# Patient Record
Sex: Male | Born: 1970
Health system: Southern US, Community
[De-identification: ages and names within clinical notes are randomized; demographics above are authoritative.]

## PROBLEM LIST (undated history)

## (undated) DIAGNOSIS — I10 Essential (primary) hypertension: Secondary | ICD-10-CM

## (undated) DIAGNOSIS — R011 Cardiac murmur, unspecified: Secondary | ICD-10-CM

## (undated) DIAGNOSIS — F1020 Alcohol dependence, uncomplicated: Secondary | ICD-10-CM

## (undated) DIAGNOSIS — F329 Major depressive disorder, single episode, unspecified: Secondary | ICD-10-CM

## (undated) DIAGNOSIS — F4310 Post-traumatic stress disorder, unspecified: Secondary | ICD-10-CM

---

## 2008-02-12 ENCOUNTER — Emergency Department (HOSPITAL_COMMUNITY): Admission: EM | Admit: 2008-02-12 | Discharge: 2008-02-13 | Payer: Self-pay | Admitting: Emergency Medicine

## 2008-02-14 ENCOUNTER — Inpatient Hospital Stay (HOSPITAL_COMMUNITY): Admission: EM | Admit: 2008-02-14 | Discharge: 2008-02-19 | Payer: Self-pay | Admitting: Emergency Medicine

## 2008-02-19 ENCOUNTER — Inpatient Hospital Stay (HOSPITAL_COMMUNITY): Admission: EM | Admit: 2008-02-19 | Discharge: 2008-02-22 | Payer: Self-pay | Admitting: Emergency Medicine

## 2008-02-24 ENCOUNTER — Inpatient Hospital Stay (HOSPITAL_COMMUNITY): Admission: EM | Admit: 2008-02-24 | Discharge: 2008-02-27 | Payer: Self-pay | Admitting: Emergency Medicine

## 2008-02-28 ENCOUNTER — Emergency Department (HOSPITAL_COMMUNITY): Admission: EM | Admit: 2008-02-28 | Discharge: 2008-02-28 | Payer: Self-pay | Admitting: Emergency Medicine

## 2008-03-04 ENCOUNTER — Emergency Department (HOSPITAL_COMMUNITY): Admission: EM | Admit: 2008-03-04 | Discharge: 2008-03-04 | Payer: Self-pay | Admitting: Emergency Medicine

## 2008-03-05 ENCOUNTER — Emergency Department (HOSPITAL_COMMUNITY): Admission: EM | Admit: 2008-03-05 | Discharge: 2008-03-05 | Payer: Self-pay | Admitting: Emergency Medicine

## 2008-03-05 ENCOUNTER — Emergency Department (HOSPITAL_BASED_OUTPATIENT_CLINIC_OR_DEPARTMENT_OTHER): Admission: EM | Admit: 2008-03-05 | Discharge: 2008-03-05 | Payer: Self-pay | Admitting: Emergency Medicine

## 2009-09-05 ENCOUNTER — Emergency Department (HOSPITAL_BASED_OUTPATIENT_CLINIC_OR_DEPARTMENT_OTHER): Admission: EM | Admit: 2009-09-05 | Discharge: 2009-09-06 | Payer: Self-pay | Admitting: Emergency Medicine

## 2009-10-31 ENCOUNTER — Emergency Department (HOSPITAL_BASED_OUTPATIENT_CLINIC_OR_DEPARTMENT_OTHER): Admission: EM | Admit: 2009-10-31 | Discharge: 2009-10-31 | Payer: Self-pay | Admitting: Emergency Medicine

## 2009-11-13 ENCOUNTER — Ambulatory Visit: Payer: Self-pay | Admitting: Diagnostic Radiology

## 2009-11-13 ENCOUNTER — Emergency Department (HOSPITAL_BASED_OUTPATIENT_CLINIC_OR_DEPARTMENT_OTHER): Admission: EM | Admit: 2009-11-13 | Discharge: 2009-11-14 | Payer: Self-pay | Admitting: Emergency Medicine

## 2010-05-17 ENCOUNTER — Emergency Department (HOSPITAL_BASED_OUTPATIENT_CLINIC_OR_DEPARTMENT_OTHER)
Admission: EM | Admit: 2010-05-17 | Discharge: 2010-05-17 | Payer: Self-pay | Source: Home / Self Care | Admitting: Emergency Medicine

## 2010-05-17 LAB — URINALYSIS, ROUTINE W REFLEX MICROSCOPIC
Bilirubin Urine: NEGATIVE
Hemoglobin, Urine: NEGATIVE
Ketones, ur: NEGATIVE mg/dL
Nitrite: NEGATIVE
Protein, ur: NEGATIVE mg/dL
Specific Gravity, Urine: 1.021 (ref 1.005–1.030)
Urine Glucose, Fasting: NEGATIVE mg/dL
Urobilinogen, UA: 0.2 mg/dL (ref 0.0–1.0)
pH: 6.5 (ref 5.0–8.0)

## 2010-06-07 ENCOUNTER — Emergency Department (HOSPITAL_BASED_OUTPATIENT_CLINIC_OR_DEPARTMENT_OTHER)
Admission: EM | Admit: 2010-06-07 | Discharge: 2010-06-07 | Payer: Self-pay | Source: Home / Self Care | Admitting: Emergency Medicine

## 2010-06-23 ENCOUNTER — Emergency Department (HOSPITAL_BASED_OUTPATIENT_CLINIC_OR_DEPARTMENT_OTHER)
Admission: EM | Admit: 2010-06-23 | Discharge: 2010-06-23 | Discharge status: Against Medical Advice | Payer: Self-pay | Attending: Emergency Medicine | Admitting: Emergency Medicine

## 2010-06-23 ENCOUNTER — Emergency Department: Payer: Self-pay | Admitting: Unknown Physician Specialty

## 2010-06-27 ENCOUNTER — Inpatient Hospital Stay (INDEPENDENT_AMBULATORY_CARE_PROVIDER_SITE_OTHER)
Admission: RE | Admit: 2010-06-27 | Discharge: 2010-06-27 | Disposition: A | Discharge status: Home / Self Care | Payer: Self-pay | Source: Ambulatory Visit

## 2010-06-27 DIAGNOSIS — IMO0002 Reserved for concepts with insufficient information to code with codable children: Secondary | ICD-10-CM

## 2010-07-29 LAB — URINALYSIS, ROUTINE W REFLEX MICROSCOPIC
Bilirubin Urine: NEGATIVE
Bilirubin Urine: NEGATIVE
Glucose, UA: NEGATIVE mg/dL
Glucose, UA: NEGATIVE mg/dL
Hgb urine dipstick: NEGATIVE
Hgb urine dipstick: NEGATIVE
Ketones, ur: NEGATIVE mg/dL
Ketones, ur: NEGATIVE mg/dL
Nitrite: NEGATIVE
Nitrite: NEGATIVE
Protein, ur: NEGATIVE mg/dL
Protein, ur: NEGATIVE mg/dL
Specific Gravity, Urine: 1.021 (ref 1.005–1.030)
Specific Gravity, Urine: 1.024 (ref 1.005–1.030)
Urobilinogen, UA: 0.2 mg/dL (ref 0.0–1.0)
Urobilinogen, UA: 0.2 mg/dL (ref 0.0–1.0)
pH: 7 (ref 5.0–8.0)
pH: 6.5 (ref 5.0–8.0)

## 2010-08-09 ENCOUNTER — Emergency Department (HOSPITAL_COMMUNITY): Payer: Self-pay

## 2010-08-09 ENCOUNTER — Emergency Department (HOSPITAL_COMMUNITY)
Admission: EM | Admit: 2010-08-09 | Discharge: 2010-08-09 | Disposition: A | Discharge status: Home / Self Care | Payer: Self-pay | Attending: Emergency Medicine | Admitting: Emergency Medicine

## 2010-08-09 DIAGNOSIS — R4182 Altered mental status, unspecified: Secondary | ICD-10-CM | POA: Insufficient documentation

## 2010-08-09 DIAGNOSIS — F29 Unspecified psychosis not due to a substance or known physiological condition: Secondary | ICD-10-CM | POA: Insufficient documentation

## 2010-08-09 DIAGNOSIS — F1010 Alcohol abuse, uncomplicated: Secondary | ICD-10-CM | POA: Insufficient documentation

## 2010-08-09 LAB — BLOOD GAS, ARTERIAL
Acid-base deficit: 6.2 mmol/L — ABNORMAL HIGH (ref 0.0–2.0)
Bicarbonate: 19.8 mEq/L — ABNORMAL LOW (ref 20.0–24.0)
Drawn by: 309961
O2 Content: 2 L/min
O2 Saturation: 93.9 %
Patient temperature: 98.6
TCO2: 17.6 mmol/L (ref 0–100)
pCO2 arterial: 42.8 mmHg (ref 35.0–45.0)
pH, Arterial: 7.288 — ABNORMAL LOW (ref 7.350–7.450)
pO2, Arterial: 78 mmHg — ABNORMAL LOW (ref 80.0–100.0)

## 2010-08-09 LAB — URINALYSIS, ROUTINE W REFLEX MICROSCOPIC
Bilirubin Urine: NEGATIVE
Glucose, UA: NEGATIVE mg/dL
Hgb urine dipstick: NEGATIVE
Ketones, ur: NEGATIVE mg/dL
Nitrite: NEGATIVE
Protein, ur: NEGATIVE mg/dL
Specific Gravity, Urine: 1.014 (ref 1.005–1.030)
Urobilinogen, UA: 0.2 mg/dL (ref 0.0–1.0)
pH: 5.5 (ref 5.0–8.0)

## 2010-08-09 LAB — DIFFERENTIAL
Basophils Absolute: 0.1 10*3/uL (ref 0.0–0.1)
Basophils Relative: 1 % (ref 0–1)
Eosinophils Absolute: 0 10*3/uL (ref 0.0–0.7)
Eosinophils Relative: 0 % (ref 0–5)
Lymphocytes Relative: 18 % (ref 12–46)
Lymphs Abs: 1.7 10*3/uL (ref 0.7–4.0)
Monocytes Absolute: 0.5 10*3/uL (ref 0.1–1.0)
Monocytes Relative: 6 % (ref 3–12)
Neutro Abs: 6.9 10*3/uL (ref 1.7–7.7)
Neutrophils Relative %: 75 % (ref 43–77)

## 2010-08-09 LAB — AMMONIA: Ammonia: 66 umol/L — ABNORMAL HIGH (ref 11–35)

## 2010-08-09 LAB — COMPREHENSIVE METABOLIC PANEL
ALT: 37 U/L (ref 0–53)
AST: 30 U/L (ref 0–37)
Albumin: 3.8 g/dL (ref 3.5–5.2)
Alkaline Phosphatase: 57 U/L (ref 39–117)
BUN: 10 mg/dL (ref 6–23)
CO2: 21 mEq/L (ref 19–32)
Calcium: 8.5 mg/dL (ref 8.4–10.5)
Chloride: 118 mEq/L — ABNORMAL HIGH (ref 96–112)
Creatinine, Ser: 1.25 mg/dL (ref 0.4–1.5)
GFR calc Af Amer: >60 mL/min (ref >60)
GFR calc non Af Amer: >60 mL/min (ref >60)
Glucose, Bld: 104 mg/dL — ABNORMAL HIGH (ref 70–99)
Potassium: 4.4 mEq/L (ref 3.5–5.1)
Sodium: 150 mEq/L — ABNORMAL HIGH (ref 135–145)
Total Bilirubin: 0.2 mg/dL — ABNORMAL LOW (ref 0.3–1.2)
Total Protein: 7.1 g/dL (ref 6.0–8.3)

## 2010-08-09 LAB — CBC
HCT: 45.3 % (ref 39.0–52.0)
Hemoglobin: 16 g/dL (ref 13.0–17.0)
MCH: 31.6 pg (ref 26.0–34.0)
MCHC: 35.3 g/dL (ref 30.0–36.0)
MCV: 89.3 fL (ref 78.0–100.0)
Platelets: 316 10*3/uL (ref 150–400)
RBC: 5.07 MIL/uL (ref 4.22–5.81)
RDW: 13.3 % (ref 11.5–15.5)
WBC: 9.2 10*3/uL (ref 4.0–10.5)

## 2010-08-09 LAB — ETHANOL: Alcohol, Ethyl (B): 275 mg/dL — ABNORMAL HIGH (ref 0–10)

## 2010-08-09 LAB — RAPID URINE DRUG SCREEN, HOSP PERFORMED
Amphetamines: NOT DETECTED
Barbiturates: POSITIVE — AB
Benzodiazepines: NOT DETECTED
Cocaine: NOT DETECTED
Opiates: NOT DETECTED
Tetrahydrocannabinol: NOT DETECTED

## 2010-08-09 LAB — GLUCOSE, CAPILLARY: Glucose-Capillary: 117 mg/dL — ABNORMAL HIGH (ref 70–99)

## 2010-08-10 LAB — URINE CULTURE
Colony Count: NO GROWTH
Culture  Setup Time: 201203291514
Culture: NO GROWTH

## 2010-09-25 NOTE — Consult Note (Signed)
Russell Nichols, Russell Nichols             ACCOUNT NO.:  [id number]   MEDICAL RECORD NO.:  [id number]          PATIENT TYPE:  INP   LOCATION:  0106                         FACILITY:  [facility]   PHYSICIAN:  Antonietta Breach, M.D.  DATE OF BIRTH:  [birth year]   DATE OF CONSULTATION:  DATE OF DISCHARGE:                                 CONSULTATION   Please see the undersigned's consult of earlier this month.   Mr. Russell Nichols after discharge began a severe alcohol binge involving  multiple forms of ethanol including Listerine.  He presented to the  emergency room with severe abdominal pain, tremors, nausea, retching.  He is not having any suicidal thoughts now.  He was having some  increased shame and suicidal thoughts earlier but these have recovered  since he has received ego support and care in the ER as well as  discussion of the 12-step principles.  He is not having any thoughts of  harming others.  He has no hallucinations or delusions.  His orientation  and memory function are intact.  He is motivated for help.   MENTAL STATUS EXAM:  Please see the above.  Mr. Russell Nichols does have gross  tremors.  He is having intermittent retching.  His insight and judgment  are intact.  He is oriented to all spheres.  Thought process is within  normal limits.  Again, see the above.   ASSESSMENT:  Alcohol dependence with need for current detox.  Mr.  Russell Nichols is not at risk to harm himself or others.   RECOMMENDATIONS:  1. General medical admission for detox and general medical support of      any co-morbidity.  2. Once he is discharged the recommendation is inpatient chemical      dependency rehabilitation.  3. Mr. Russell Nichols is motivated to see a chaplain for additional spiritual      counseling.  4. Please have the case manager social worker put some 12-step      materials in the room for the patient.  5. Mr. Russell Nichols agrees to call a nursing station or if outside the      hospital 9-1-1 for any thoughts of  harming himself, thoughts of      harming others or distress.      Antonietta Breach, M.D.  Electronically Signed     JW/MEDQ  D:  02/24/2008  T:  02/24/2008  Job:  [id number]
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
## 2010-09-25 NOTE — Consult Note (Signed)
NAMEZACHAREY, Russell Nichols             ACCOUNT NO.:  [id number]   MEDICAL RECORD NO.:  [id number]          PATIENT TYPE:  INP   LOCATION:  1516                         FACILITY:  [facility]   PHYSICIAN:  Russell Nichols, M.D.  DATE OF BIRTH:  1970-10-02   DATE OF CONSULTATION:  02/16/2008  DATE OF DISCHARGE:                                 CONSULTATION   REFERRING PHYSICIAN:  [facility].   REASON FOR CONSULTATION:  Alcohol dependence.   HISTORY OF PRESENT ILLNESS:  Mr. Russell Nichols is a 40 year old male admitted  to the [facility] on the 4th of October 2009 due to alcohol  intoxication.   Mr. Russell Nichols has been drinking a pint of liquor daily along with  Listerine and other sources of ethanol that he can get a hold of.  His  mood and interests are normal.  He does not have any thoughts of harming  himself or others.  His orientation and memory function are intact.   PAST PSYCHIATRIC HISTORY:  No history of mania.  Mr. Russell Nichols did spend a  recent inpatient rehabilitation program at [facility].   FAMILY PSYCHIATRIC HISTORY:  None known.   SOCIAL HISTORY:  Mr. Russell Nichols is single.  He does not have any children.  He used to work in [employer].  He has been drinking 1 pint  of liquor per day regularly.   PAST MEDICAL HISTORY:  Abdominal pain.   Alcohol level was 376 in the emergency room.  SGOT 29, SGPT 24.  Urine  drug screen:  Benzodiazepines.   ALLERGIES:  PENICILLIN.   MEDICATIONS:  No psychotropics other than the Ativan detox protocol.  He  is on thiamine 100 mg daily.   REVIEW OF SYSTEMS:  Noncontributory.   Temperature 98.4, pulse 74, respiratory rate 16, blood pressure 127/87,  O2 saturation on room air 95%.   MENTAL STATUS EXAM:  Mr. Russell Nichols is alert.  He is oriented to all  spheres.  Thought process logical, coherent, goal-directed.  No  looseness of associations.  Affect broad and appropriate.  Thought  content:  No thoughts of harming himself.  No  thoughts of harming  others, no delusions, no hallucinations.  Speech within normal limits.  Affect broad and appropriate.  Memory intact, orientation intact.  Insight and judgment intact.   ASSESSMENT:  Axis I:  Alcohol dependence.  Axis II:  Deferred.  Axis III:  See past medical history.  Axis IV:  Primary support group.  Axis V:  55.   Mr. Russell Nichols agrees to not drive if drowsy.  He agrees to call emergency  services if he develops thoughts of harming himself or others.   Mr. Russell Nichols is not at risk to harm himself.   The undersigned provided ego supportive psychotherapy and education  including a reinforcement of 12-step group attendance.   RECOMMENDATIONS:  Would proceed with the Ativan detox protocol.  Would  continue thiamine 100 mg daily indefinitely.   Mr. Russell Nichols declines any inpatient chemical dependency rehabilitation at  this time.  He wants to attend an outpatient program.  Would ask the  case manager  or social worker to look into outpatient [facility] and 12-  step group information for the patient.      Russell Nichols, M.D.  Electronically Signed     JW/MEDQ  D:  02/17/2008  T:  02/17/2008  Job:  [id number]   cc:   [facility]

## 2010-09-25 NOTE — H&P (Signed)
NAMEMCLANE, ARORA             ACCOUNT NO.:  [id number]   MEDICAL RECORD NO.:  [id number]          PATIENT TYPE:  INP   LOCATION:  0106                         FACILITY:  [facility]   PHYSICIAN:  Lonia Blood, M.D.       DATE OF BIRTH:  1970-10-25   DATE OF ADMISSION:  02/23/2008  DATE OF DISCHARGE:                              HISTORY & PHYSICAL   PRIMARY CARE PHYSICIAN:  This patient does not have a primary care  physician.   CHIEF COMPLAINT:  Abdominal pain and suicidal ideation.   HISTORY OF PRESENT ILLNESS:  Mr. Rosengren is a 40 year old gentleman who  presented to the emergency room after he called EMS and said  he was  trying to commit suicide by drinking a bottle of mouthwash.  He had  severe abdominal pain and was transported to the emergency room where he  was observed and later cleared to go to a psychiatric facility.  While  sitting in the emergency room, the patient tried to elope and he was  brought back and committed via the magistrate.  While the patient was in  the emergency room waiting for psychiatric admission he drank some hand  sanitizer.  The patient is complaining of severe abdominal pain and  nausea.  On February 14, 2008 the patient was admitted for abdominal pain,  nausea and vomiting and was seen by the psychiatrist and cleared to go  to outpatient alcohol detoxification.  The patient never pursued alcohol  cessation and in fact he continued to drink alcohol heavily.  There are  no reports of vomiting blood or coffee-ground emesis in the emergency  room.  Currently the patient is sedated and barely able to arouse,  saying that his stomach is not in so much pain, but overall he cannot  stay awake for more than 5 seconds.   PAST MEDICAL HISTORY:  Substance abuse and maybe pancreatitis.   MEDICATIONS:  None.   ALLERGIES:  PENICILLIN.   SOCIAL HISTORY:  The patient lives with his fiancee, he is unemployed.  He used to work in [employer].  He  drinks liquor heavily  every day, smokes a half pack of cigarettes a day.   FAMILY HISTORY:  Positive for substance abuse.  Negative for depression.   PHYSICAL EXAMINATION:  VITAL SIGNS:  Temperature 98.3, blood pressure  142/95, heart rate 105, respiratory rate 20, saturation 95% on room-ir.  GENERAL APPEARANCE:  A lethargic gentleman, arousable, in some in no  acute distress.  He was oriented to place.  HEENT:  Eyes:  Pupils equal, round, reactive to light and accommodation.  Extraocular movements intact.  Throat clear.  NECK:  Supple.  No JVD.  CHEST:  Clear without wheeze, rhonchi or crackles.  HEART:  Regular rate and rhythm without murmurs, rubs or gallops.  ABDOMEN:  Soft.  There is significant tenderness in the epigastric area.  No rebound no guarding.  Bowel sounds are decreased but present.  LOWER EXTREMITIES:  Without edema.  SKIN:  The skin is covered with tattoos and the excoriations.  NEUROLOGIC EXAM:  Cranial nerves II-XII are  intact.  Cannot ambulate the  patient since he is too unsteady from his sedation.  He seems to have  intact strength as he is able to pull himself up from the stretcher.  Deep tendon reflexes are +2 bilaterally symmetric.   LABORATORY VALUES ON ADMISSION:  White blood cell count is 8.3,  hemoglobin 16.5, platelet count is 333.  Urine drug screen positive for  opiates and benzodiazepines.  Alcohol level is 178. Lipase, amylase  within normal limits.  Complete metabolic profile within normal limits   ASSESSMENT AND PLAN:  1. Abdominal pain, nausea, vomiting, suspect due to ingestion of      alcohol mouthwash.  My plan is to closely observe the patient.  I      will place the patient on a proton pump inhibitor and symptomatic      treatment.  Will obtain a stat serum osmolality level to rule out      ingestion of isopropyl alcohol for the time being.  I will keep the      patient on telemetry.  As to prevent worsening delirium and      agitation  from alcohol withdrawal the patient will be started on an      Ativan protocol.  Emergent psychiatric evaluation has been      requested.  DVT prophylaxis will be done using heparin.      Lonia Blood, M.D.  Electronically Signed     SL/MEDQ  D:  02/24/2008  T:  02/24/2008  Job:  [id number]

## 2010-09-28 NOTE — Discharge Summary (Signed)
[name], Russell Nichols             ACCOUNT NO.:  [id number]   MEDICAL RECORD NO.:  [id number]          PATIENT TYPE:  INP   LOCATION:  1502                         FACILITY:  [facility]   PHYSICIAN:  Lonia Blood, M.D.       DATE OF BIRTH:  24-Dec-1970   DATE OF ADMISSION:  02/23/2008  DATE OF DISCHARGE:  02/27/2008                               DISCHARGE SUMMARY   PRIMARY CARE PHYSICIAN:  [facility].   DISCHARGE DIAGNOSES:  1. Nausea, vomiting, abdominal pain most likely due to gastritis and      esophagitis.  2. Alcohol withdrawal.  3. Alcoholism.  4. Acute alcoholic intoxication, resolved.  5. Suicidal ideations.  6. Depression.  7. Tobacco use   DISCHARGE MEDICATIONS:  1. Folic acid 1 mg daily.  2. Vitamin B1 100 mg daily.  3. Omeprazole 20 mg twice a day.  4. Ativan 1 mg to take three a day then two a day and then one a day      and eventually to stop.   HOSPITAL PROCEDURES:  No procedures done.   HOSPITAL CONSULTATIONS:  The patient was seen in consultation by Dr.  Antonietta Breach from psychiatry.   HISTORY AND PHYSICAL:  Refer to dictated H and P done by Dr. Lonia Blood  on February 23, 2008.   HOSPITAL COURSE:  [name] is a 40 year old gentleman with  significant alcohol abuse who presented to the emergency room with  complaints of severe abdominal pain, nausea, vomiting. While he was  awaiting to be evaluated, he jumped out of bed and drank some of the  disinfectant gel that was on the wall.  The patient was admitted for  further workup and observation. During his brief hospital stay  the  patient required intensive treatment for his nausea and vomiting with  intravenous fluids, antiemetics and analgesics.  The patient was seen by  Dr. Antonietta Breach from psychiatry who felt the patient was safe to discharge  with strong encouragments to pursue outpatient alcohol detoxification.  [name] was discharged home on February 27, 2008, with  instructions  to follow up with [facility] outpatient programm and with [facility] for his medical needs.      Lonia Blood, M.D.  Electronically Signed     SL/MEDQ  D:  03/24/2008  T:  03/25/2008  Job:  [id number]   cc:   Melvern Banker  Fax: [phone]

## 2010-09-28 NOTE — Discharge Summary (Signed)
NAMEBRAISON, Russell Nichols             ACCOUNT NO.:  [id number]   MEDICAL RECORD NO.:  [id number]          PATIENT TYPE:  INP   LOCATION:  1516                         FACILITY:  [facility]   PHYSICIAN:  Lonia Blood, M.D.      DATE OF BIRTH:  1971-05-10   DATE OF ADMISSION:  02/19/2008  DATE OF DISCHARGE:  02/22/2008                               DISCHARGE SUMMARY   PRIMARY CARE PHYSICIAN:  The patient was unassigned to [place].   DISCHARGE DIAGNOSES:  1. Alcohol intoxication and withdrawal.  2. Chronic alcoholism.  The patient drinking hand sanitizer in the      hospital.  3. Gastritis with abdominal pain.   DISCHARGE MEDICATIONS:  1. Ativan 1 mg p.o. every 8 hours p.r.n.  2. Lortab 5/500 one tablet every 6 hours p.r.n.  3. Thiamine 100 mg daily.  4. Folic acid 1 mg daily.   DISPOSITION:  The patient seemed to be sober at this point.  He has  received counseling on alcoholism and also resources given to the  patient to follow up with outpatient substance abuse program.   PROCEDURES PERFORMED:  None.   CONSULTATION:  Mainly psychiatric.   BRIEF HISTORY AND PHYSICAL:  Please refer to dictated history and  physical earlier and that also by Dr. Lodema Hong.  In short, however, the  patient was re-admitted on October 9 after leaving the hospital that  morning against medical advice.  The patient was undergoing  detoxification for his alcoholism when he suddenly decided to leave AMA  to be with his girlfriend who was also found to have alcohol  intoxication and subsequently admitted.  While his girlfriend was  visiting him, they emptied all the hand sanitizer in the patient's room  and nearby and drank it and became intoxicated.  Subsequently, he was re-  admitted with an alcohol level that was significantly elevated at 74.  The patient was sent to the intensive care unit for further management.   HOSPITAL COURSE:  1. Alcohol intoxication and withdrawal.  This is a recurrence cycle      for  Mr. Goga.  He was admitted initially for observation in the      stepdown unit and later to the floor.  We continued to manage him      accordingly with the Librium protocol.  He was subsequently      discharged after his symptoms have subsided and given the necessary      support again to follow up.  During this hospitalization, measures      were taken to avoid having hand sanitizer in the patient's room or      even across his room in the corridor.  The patient was also      confined to his room during      this hospitalization for said two reasons.  At the time of      discharge, however, he seemed to be alert, oriented.  2. Gastritis.  Again this is alcohol-induced and the patient continued      to be on PPI.  Otherwise the patient was discharged in a  stable      manner.      Lonia Blood, M.D.  Electronically Signed     LG/MEDQ  D:  03/29/2008  T:  03/30/2008  Job:  [id number]

## 2010-09-28 NOTE — H&P (Signed)
Russell Nichols, BERGUM NO.:  [id number]   MEDICAL RECORD NO.:  [id number]          PATIENT TYPE:  INP   LOCATION:  1516                         [facility]ACILITY:  [facility]   PHYSICIAN:  Hettie Holstein, D.O.    DATE OF BIRTH:  12-18-1970   DATE OF ADMISSION:  02/19/2008  DATE OF DISCHARGE:  02/22/2008                              HISTORY & PHYSICAL   HISTORY OF PRESENT ILLNESS:  Please note that Mr. Sikorski earlier in the  day had been hospitalized.  For full details, please refer to the  initial H&P as well as the discharge summary is dictated by Dr. Mikeal Hawthorne.  However, he left against medical advice.  His girlfriend while visiting  him in the [employer], collapsed in the basement and  therefore to attend to her, he signed out against medical advice.  They  both suffered with chronic and refractory alcoholism and apparently  there had been some reports of abusing and ingesting the hand sanitizer  for the alcohol content.  In any event, while awaiting and attending to  his girlfriend while in the emergency department, he began to develop  symptoms of withdrawal.  He had notified the emergency room staff, as  well as myself, that he was feeling very anxious and nauseated.  He had  been calm earlier in the day, subsequently, he was found in the hallway  by another physician and a bystander.  He was directed to the emergency  room.  In the emergency room, he was found to be exhibiting symptoms of  delirium tremens.  He developed nausea and vomiting.  He had fallen, was  found on the ground in the hallway, though he denied passing out.  He  was seen in the emergency department and was initiated on DT  prophylactic measures and workup.  He had no prescribed medications as  he had just left the hospital on the 9th, just a few hours previously  against medical advise.   His medical history was significant for chronic alcoholism as described  above, hypertension, substance  abuse, tobacco abuse, and gastritis.   ALLERGIES:  He reported PENICILLIN allergy.   SOCIAL HISTORY:  Apparently, he continuous with alcohol abuse and  tobacco abuse.  In addition, he is unemployed and is living with his  fiancee.   FAMILY HISTORY:  Noncontributory.   REVIEW OF SYSTEMS:  He was hospitalized just a few hours previously, but  left against medical advise.  He is now reporting agitation, anxiety,  nausea, and vomiting.  No bloody emesis or no bloody stools that were  reported.   PHYSICAL EXAMINATION:  VITAL SIGNS:  In the emergency department, he is  tachycardic with a heart rate of 111, blood pressure 123/88,  respirations 21, temperature 99, O2 saturation 94%.  HEENT:  His head is normocephalic and atraumatic.  CARDIOVASCULAR:  Normal S1 and S2 without appreciable murmur.  LUNGS:  Exhibited normal effort.  No wheeze or rhonchi.  No dullness on  percussions.  ABDOMEN:  Soft and nontender without rebound or guarding.  LOWER EXTREMITIES:  No edema.  Peripheral pulse  were symmetrical and  palpable.  His skin revealed normal coloration without rash.   LABORATORY DATA:  None were available at the time of admission.  However, alcohol level was ordered and was found to be 73 on February 19, 2008.  At around 8:54, lipase was 35.   ASSESSMENT:  1. Alcohol intoxication/withdrawal and toxicity due to ingestion of      hand sanitizer.  2. Gastritis.  3. Tobacco abuse.   PLAN:  At this time, Mr. Tsang was readmitted after leaving against  medical advise for detoxification and symptomatic management of his  gastritis and assistance with substance abuse from social services.      Hettie Holstein, D.O.  Electronically Signed     ESS/MEDQ  D:  06/09/2008  T:  06/10/2008  Job:  [id number]

## 2010-09-28 NOTE — Discharge Summary (Signed)
NAMETHEORDORE, CISNERO             ACCOUNT NO.:  [id number]   MEDICAL RECORD NO.:  [id number]          PATIENT TYPE:  INP   LOCATION:  1516                         FACILITY:  [facility]   PHYSICIAN:  Lonia Blood, M.D.      DATE OF BIRTH:  Mar 18, 1971   DATE OF ADMISSION:  02/14/2008  DATE OF DISCHARGE:  02/19/2008                               DISCHARGE SUMMARY   PRIMARY CARE PHYSICIAN:  The patient is unassigned.   DISCHARGE DIAGNOSES:  1. Alcohol withdrawal and intoxication.  2. Hypertension.  3. Polysubstance abuse.   DISCHARGE MEDICATIONS:  The patient left AMA, as such no medications  were on board.   DISPOSITION:  Again the patient left AMA on that day.   PROCEDURES PERFORMED:  Abdominal x-ray performed on February 14, 2008  showed no dilated bowel in the upper abdomen.  No free intraperitoneal  gas.   CONSULTATIONS:  None.   BRIEF HISTORY/PHYSICAL:  Please refer to dictated history and physical  by Dr. Isidor Holts on February 19, 2008.  In short however, patient is  a 40 year old male that came in with abdominal pain, vomiting and  alcohol abuse.  The patient has a longstanding history of alcoholism.  He takes average 1 pint of liquor daily and has been drinking everything  he could get his hands on including cough syrup and Listerine.  The  patient was aggressive and belligerent.  Came in after punching his  windows and sustained also a laceration 2 days earlier.  He was having  continuous vomiting and some abdominal pain.  His alcohol level was also  elevated at 341.  His lipase was 46.  He was tachycardiac and tremulous.  He was admitted with diagnosis of alcohol intoxication.   HOSPITAL COURSE:  1. Alcohol abuse and intoxication.  The patient was admitted and      started on the CIWA  protocol.  He later started exhibiting      withdrawal symptoms.  He was continued on CIWA protocol.  The      patient started recovering, but on February 19, 2008, he suddenly  decided to leave the hospital after his girlfriend became      intoxicated and was also subsequently admitted in the hospital      herself.  The patient left and signed AMA.  At that point, he was      counseled in making that decision.  2. Chronic alcoholism.  Again, the patient was on thiamine and folate      in the hospital.  3. Right arm laceration.  He continued to have wound care in the      hospital.  4. Hypertension.  Blood pressure was fully controlled.  5. Polysubstance abuse.  The patient continued to have counseling      including psych consult.  This has been recurrent for the patient      and he has been going in and out of the hospital with the same      problem.  Otherwise, no further followup was set up since the      patient left AMA.  Lonia Blood, M.D.  Electronically Signed     LG/MEDQ  D:  03/29/2008  T:  03/30/2008  Job:  [id number]

## 2010-11-20 ENCOUNTER — Emergency Department (HOSPITAL_COMMUNITY): Payer: Self-pay

## 2010-11-20 ENCOUNTER — Emergency Department (HOSPITAL_COMMUNITY)
Admission: EM | Admit: 2010-11-20 | Discharge: 2010-11-20 | Disposition: A | Discharge status: Home / Self Care | Payer: No Typology Code available for payment source | Attending: Emergency Medicine | Admitting: Emergency Medicine

## 2010-11-20 DIAGNOSIS — S91009A Unspecified open wound, unspecified ankle, initial encounter: Secondary | ICD-10-CM | POA: Insufficient documentation

## 2010-11-20 DIAGNOSIS — G8929 Other chronic pain: Secondary | ICD-10-CM | POA: Insufficient documentation

## 2010-11-20 DIAGNOSIS — T07XXXA Unspecified multiple injuries, initial encounter: Secondary | ICD-10-CM | POA: Insufficient documentation

## 2010-11-20 DIAGNOSIS — M545 Low back pain, unspecified: Secondary | ICD-10-CM | POA: Insufficient documentation

## 2010-11-20 DIAGNOSIS — R109 Unspecified abdominal pain: Secondary | ICD-10-CM | POA: Insufficient documentation

## 2010-11-20 DIAGNOSIS — S01501A Unspecified open wound of lip, initial encounter: Secondary | ICD-10-CM | POA: Insufficient documentation

## 2010-11-20 DIAGNOSIS — M549 Dorsalgia, unspecified: Secondary | ICD-10-CM | POA: Insufficient documentation

## 2010-11-20 DIAGNOSIS — S81009A Unspecified open wound, unspecified knee, initial encounter: Secondary | ICD-10-CM | POA: Insufficient documentation

## 2010-11-20 DIAGNOSIS — M542 Cervicalgia: Secondary | ICD-10-CM | POA: Insufficient documentation

## 2010-11-20 DIAGNOSIS — M546 Pain in thoracic spine: Secondary | ICD-10-CM | POA: Insufficient documentation

## 2010-11-20 DIAGNOSIS — R4789 Other speech disturbances: Secondary | ICD-10-CM | POA: Insufficient documentation

## 2010-11-20 DIAGNOSIS — S0180XA Unspecified open wound of other part of head, initial encounter: Secondary | ICD-10-CM | POA: Insufficient documentation

## 2010-11-20 LAB — COMPREHENSIVE METABOLIC PANEL
AST: 37 U/L (ref 0–37)
Albumin: 4.3 g/dL (ref 3.5–5.2)
Chloride: 106 mEq/L (ref 96–112)
Creatinine, Ser: 1.13 mg/dL (ref 0.50–1.35)
Potassium: 3.7 mEq/L (ref 3.5–5.1)
Total Bilirubin: 0.3 mg/dL (ref 0.3–1.2)
Total Protein: 7.9 g/dL (ref 6.0–8.3)

## 2010-11-20 LAB — LACTIC ACID, PLASMA: Lactic Acid, Venous: 1.7 mmol/L (ref 0.5–2.2)

## 2010-11-20 LAB — URINALYSIS, ROUTINE W REFLEX MICROSCOPIC
Glucose, UA: NEGATIVE mg/dL
Hgb urine dipstick: NEGATIVE
Protein, ur: NEGATIVE mg/dL
Specific Gravity, Urine: 1.025 (ref 1.005–1.030)
pH: 5 (ref 5.0–8.0)

## 2010-11-20 LAB — CBC
HCT: 49.7 % (ref 39.0–52.0)
Hemoglobin: 18.4 g/dL — ABNORMAL HIGH (ref 13.0–17.0)
RDW: 12.1 % (ref 11.5–15.5)
WBC: 15.4 10*3/uL — ABNORMAL HIGH (ref 4.0–10.5)

## 2010-11-20 LAB — POCT I-STAT, CHEM 8
BUN: 18 mg/dL (ref 6–23)
Calcium, Ion: 1 mmol/L — ABNORMAL LOW (ref 1.12–1.32)
Creatinine, Ser: 1.5 mg/dL — ABNORMAL HIGH (ref 0.50–1.35)
TCO2: 19 mmol/L (ref 0–100)

## 2010-11-20 LAB — PROTIME-INR: INR: 0.88 (ref 0.00–1.49)

## 2010-11-20 LAB — ETHANOL: Alcohol, Ethyl (B): 289 mg/dL — ABNORMAL HIGH (ref 0–11)

## 2010-11-20 MED ORDER — IOHEXOL 300 MG/ML  SOLN
100.0000 mL | Freq: Once | INTRAMUSCULAR | Status: AC | PRN
  Administered 2010-11-20: 100 mL via INTRAVENOUS

## 2010-11-26 ENCOUNTER — Emergency Department: Payer: Self-pay | Admitting: Emergency Medicine

## 2011-02-12 LAB — COMPREHENSIVE METABOLIC PANEL
ALT: 14
ALT: 29
AST: 16
AST: 24
AST: 30
AST: 32
AST: 32
Albumin: 3.8
Albumin: 3.9
Albumin: 4.3
Alkaline Phosphatase: 70
Alkaline Phosphatase: 74
Alkaline Phosphatase: 84
BUN: 6
CO2: 25
CO2: 25
CO2: 25
CO2: 32
Calcium: 8.6
Calcium: 9
Chloride: 104
Chloride: 106
Chloride: 108
Creatinine, Ser: 0.83
Creatinine, Ser: 0.99
Creatinine, Ser: 1.08
Creatinine, Ser: 1.11
GFR calc Af Amer: >60
GFR calc Af Amer: >60
GFR calc Af Amer: >60
GFR calc Af Amer: >60
GFR calc Af Amer: >60
GFR calc Af Amer: >60
GFR calc non Af Amer: >60
GFR calc non Af Amer: >60
GFR calc non Af Amer: >60
GFR calc non Af Amer: >60
Glucose, Bld: 100 — ABNORMAL HIGH
Glucose, Bld: 107 — ABNORMAL HIGH
Potassium: 3.4 — ABNORMAL LOW
Potassium: 3.8
Potassium: 4.3
Sodium: 143
Total Bilirubin: 0.6
Total Bilirubin: 0.8
Total Bilirubin: 0.8
Total Protein: 7
Total Protein: 7.4

## 2011-02-12 LAB — ETHANOL
Alcohol, Ethyl (B): 178 — ABNORMAL HIGH
Alcohol, Ethyl (B): 249 — ABNORMAL HIGH

## 2011-02-12 LAB — BASIC METABOLIC PANEL
BUN: 10
BUN: 4 — ABNORMAL LOW
BUN: 5 — ABNORMAL LOW
BUN: 8
CO2: 20
CO2: 26
CO2: 26
Calcium: 8.6
Chloride: 104
Chloride: 110
Chloride: 113 — ABNORMAL HIGH
Creatinine, Ser: 0.99
GFR calc non Af Amer: >60
GFR calc non Af Amer: >60
Glucose, Bld: 100 — ABNORMAL HIGH
Glucose, Bld: 101 — ABNORMAL HIGH
Glucose, Bld: 104 — ABNORMAL HIGH
Glucose, Bld: 91
Potassium: 3.5
Potassium: 3.5
Potassium: 3.7
Sodium: 138
Sodium: 143

## 2011-02-12 LAB — BLOOD GAS, ARTERIAL
Bicarbonate: 26.3 — ABNORMAL HIGH
FIO2: 0.21
Patient temperature: 98.6
TCO2: 22.3
pH, Arterial: 7.433
pO2, Arterial: 91.1

## 2011-02-12 LAB — URINALYSIS, ROUTINE W REFLEX MICROSCOPIC
Bilirubin Urine: NEGATIVE
Bilirubin Urine: NEGATIVE
Glucose, UA: NEGATIVE
Hgb urine dipstick: NEGATIVE
Ketones, ur: NEGATIVE
Nitrite: NEGATIVE
Nitrite: NEGATIVE
Specific Gravity, Urine: 1.009
Specific Gravity, Urine: 1.01
Specific Gravity, Urine: 1.005
Urobilinogen, UA: 0.2
pH: 5.5

## 2011-02-12 LAB — DIFFERENTIAL
Basophils Absolute: 0
Basophils Absolute: 0
Basophils Absolute: 0.1
Basophils Relative: 1
Eosinophils Absolute: 0.1
Eosinophils Absolute: 0.1
Eosinophils Relative: 0
Eosinophils Relative: 1
Eosinophils Relative: 1
Eosinophils Relative: 2
Lymphocytes Relative: 17
Lymphocytes Relative: 23
Lymphocytes Relative: 25
Lymphocytes Relative: 37
Lymphs Abs: 2.2
Lymphs Abs: 2.7
Lymphs Abs: 4.2 — ABNORMAL HIGH
Monocytes Absolute: 0.5
Monocytes Absolute: 0.6
Monocytes Relative: 6
Monocytes Relative: 9
Neutro Abs: 4.4
Neutro Abs: 4.8
Neutro Abs: 6.1
Neutro Abs: 7.3
Neutrophils Relative %: 61
Neutrophils Relative %: 68

## 2011-02-12 LAB — RAPID URINE DRUG SCREEN, HOSP PERFORMED
Amphetamines: NOT DETECTED
Amphetamines: NOT DETECTED
Amphetamines: NOT DETECTED
Barbiturates: NOT DETECTED
Barbiturates: NOT DETECTED
Barbiturates: NOT DETECTED
Barbiturates: NOT DETECTED
Benzodiazepines: NOT DETECTED
Benzodiazepines: NOT DETECTED
Benzodiazepines: POSITIVE — AB
Benzodiazepines: POSITIVE — AB
Cocaine: NOT DETECTED
Cocaine: NOT DETECTED
Cocaine: POSITIVE — AB
Opiates: NOT DETECTED
Opiates: NOT DETECTED
Opiates: POSITIVE — AB
Tetrahydrocannabinol: NOT DETECTED

## 2011-02-12 LAB — CBC
HCT: 42.6
HCT: 50.7
Hemoglobin: 14.5
Hemoglobin: 15
Hemoglobin: 17.2 — ABNORMAL HIGH
Hemoglobin: 18 — ABNORMAL HIGH
MCHC: 33
MCHC: 34.4
MCHC: 34.5
MCHC: 34.6
MCV: 91.1
MCV: 91.8
MCV: 92.5
MCV: 92.8
Platelets: 272
Platelets: 426 — ABNORMAL HIGH
Platelets: 447 — ABNORMAL HIGH
RBC: 4.64
RBC: 4.69
RBC: 5.26
RBC: 5.52
RBC: 5.66
RDW: 13.1
RDW: 13.6
RDW: 13.6
RDW: 14.2
RDW: 14.2
WBC: 6.7
WBC: 8.3
WBC: 9.5

## 2011-02-12 LAB — PROTIME-INR: INR: 0.9

## 2011-02-12 LAB — HEPATIC FUNCTION PANEL
AST: 29
Albumin: 4.1
Bilirubin, Direct: 0.1
Total Bilirubin: 0.9

## 2011-02-12 LAB — ACETAMINOPHEN LEVEL: Acetaminophen (Tylenol), Serum: <10 — ABNORMAL LOW

## 2011-02-12 LAB — LIPASE, BLOOD
Lipase: 30
Lipase: 39
Lipase: 56

## 2011-02-12 LAB — POCT TOXICOLOGY PANEL

## 2011-02-12 LAB — SALICYLATE LEVEL: Salicylate Lvl: <4

## 2013-12-07 ENCOUNTER — Encounter (HOSPITAL_BASED_OUTPATIENT_CLINIC_OR_DEPARTMENT_OTHER): Payer: Self-pay | Admitting: Emergency Medicine

## 2013-12-07 ENCOUNTER — Emergency Department (HOSPITAL_BASED_OUTPATIENT_CLINIC_OR_DEPARTMENT_OTHER)
Admission: EM | Admit: 2013-12-07 | Discharge: 2013-12-07 | Disposition: A | Discharge status: Home / Self Care | Payer: No Typology Code available for payment source | Attending: Emergency Medicine | Admitting: Emergency Medicine

## 2013-12-07 DIAGNOSIS — R21 Rash and other nonspecific skin eruption: Secondary | ICD-10-CM | POA: Insufficient documentation

## 2013-12-07 DIAGNOSIS — L738 Other specified follicular disorders: Secondary | ICD-10-CM | POA: Insufficient documentation

## 2013-12-07 DIAGNOSIS — L678 Other hair color and hair shaft abnormalities: Secondary | ICD-10-CM | POA: Insufficient documentation

## 2013-12-07 DIAGNOSIS — Z880 Allergy status to penicillin: Secondary | ICD-10-CM | POA: Insufficient documentation

## 2013-12-07 DIAGNOSIS — L739 Follicular disorder, unspecified: Secondary | ICD-10-CM

## 2013-12-07 MED ORDER — SULFAMETHOXAZOLE-TRIMETHOPRIM 800-160 MG PO TABS: 1.0000 | ORAL_TABLET | Freq: Two times a day (BID) | ORAL | Status: DC

## 2013-12-07 NOTE — ED Provider Notes (Signed)
CSN: [id number]     Arrival date & time 12/07/13  1755 History  This chart was scribed for Junius Argyle, MD by Julian Hy, ED Scribe. The patient was seen in MH09/MH09. The patient's care was started at 6:46 PM.     Chief Complaint  Patient presents with  . Abscess   Patient is a 43 y.o. male presenting with abscess. The history is provided by the patient. No language interpreter was used.  Abscess Location:  Leg Leg abscess location:  L upper leg and R upper leg Abscess quality: warmth   Abscess quality: not draining   Duration:  1 week Progression:  Worsening Chronicity:  New Relieved by:  None tried Worsened by:  Nothing tried Ineffective treatments:  None tried Associated symptoms: no fatigue, no fever, no headaches, no nausea and no vomiting    HPI Comments: Russell Nichols is a 43 y.o. male who presents to the Emergency Department complaining of new, sudden, gradually worsening large rashes on his lower extremities bilaterally onset one week ago. Pt reports this began after returning from [place]. Pt reports the wounds are warm to the touch and red. Pt states he had a similar episode about 10-15 years ago after he swam in a pool with dead birds in it. Pt denies taking anything to relieve his symptoms. Pt denies his rashes are draining. Pt denies assocaited fever or vomiting. Pt denies being exposed to anything that he thinks would cause this. Pt reports he is an avid hiker. Pt denies having insurance.   History reviewed. No pertinent past medical history. History reviewed. No pertinent past surgical history. No family history on file. History  Substance Use Topics  . Smoking status: Never Smoker   . Smokeless tobacco: Current User    Types: Chew  . Alcohol Use: Not on file    Review of Systems  Constitutional: Negative for fever and fatigue.  HENT: Negative for congestion and drooling.   Eyes: Negative for pain.  Respiratory: Negative for cough and shortness of  breath.   Cardiovascular: Negative for chest pain.  Gastrointestinal: Negative for nausea, vomiting, abdominal pain and diarrhea.  Genitourinary: Negative for dysuria and hematuria.  Musculoskeletal: Negative for neck pain.  Skin: Positive for rash (lower extremities bilaterally). Negative for color change.  Neurological: Negative for dizziness and headaches.  Hematological: Negative for adenopathy.  Psychiatric/Behavioral: Negative for behavioral problems.  All other systems reviewed and are negative.     Allergies  Penicillins  Home Medications   Prior to Admission medications   Not on File   Triage Vitals: BP 150/99  Pulse 89  Temp(Src) 98.7 F (37.1 C) (Oral)  Resp 16  Ht 6' (1.829 m)  Wt 180 lb (81.647 kg)  BMI 24.41 kg/m2  SpO2 100% Physical Exam  Nursing note and vitals reviewed. Constitutional: He is oriented to person, place, and time. He appears well-developed and well-nourished. No distress.  HENT:  Head: Normocephalic and atraumatic.  Mouth/Throat: Oropharynx is clear and moist.  Eyes: Conjunctivae and EOM are normal. Pupils are equal, round, and reactive to light.  Neck: Neck supple. No tracheal deviation present.  Cardiovascular: Normal rate, regular rhythm, normal heart sounds and intact distal pulses.  Exam reveals no gallop and no friction rub.   No murmur heard. Pulmonary/Chest: Effort normal and breath sounds normal. No respiratory distress.  Abdominal: Soft. He exhibits no distension and no mass. There is no tenderness. There is no rebound and no guarding.  Musculoskeletal: Normal range of  motion.  Neurological: He is alert and oriented to person, place, and time.  Skin: Skin is warm and dry. Rash noted.  Mild erythematous papular lesions on the anterior thighs bilaterally.   Mild central excoriation with mild surrounding erythema and induration on the left anterior thigh  Psychiatric: He has a normal mood and affect. His behavior is normal.     ED Course  Procedures (including critical care time) DIAGNOSTIC STUDIES: Oxygen Saturation is 100% on RA, normal by my interpretation.    COORDINATION OF CARE: 6:53 PM- Will prescribe an antibiotic. Pt encouraged to use warm compresses on his rashes at night. Patient informed of current plan for treatment and evaluation and agrees with plan at this time.  Labs Review Labs Reviewed - No data to display  Imaging Review No results found.   EKG Interpretation None      MDM   Final diagnoses:  Folliculitis    7:00 PM 43 y.o. male here with a rash that began approximately 5 days ago. He denies any tick exposures. He appears to have folliculitis on the anterior thighs. He denies any fevers or headaches. I used bedside ultrasound to evaluate a small area of induration with a central excoriation on the left anterior thigh. There is no underlying fluid for drainage. The patient previously had severe nausea and headache with Keflex. Will place him on Bactrim.  7:01 PM:  I have discussed the diagnosis/risks/treatment options with the patient and believe the pt to be eligible for discharge home to follow-up with pcp as needed. We also discussed returning to the ED immediately if new or worsening sx occur. We discussed the sx which are most concerning (e.g., fever, HA, worsening rash, concern for abscess) that necessitate immediate return. Medications administered to the patient during their visit and any new prescriptions provided to the patient are listed below.  Medications given during this visit Medications - No data to display  New Prescriptions   SULFAMETHOXAZOLE-TRIMETHOPRIM (SEPTRA DS) 800-160 MG PER TABLET    Take 1 tablet by mouth 2 (two) times daily.      I personally performed the services described in this documentation, which was scribed in my presence. The recorded information has been reviewed and is accurate.    Junius ArgyleForrest S Chrisanne Loose, MD 12/07/13 1902

## 2013-12-07 NOTE — Discharge Instructions (Signed)

## 2013-12-07 NOTE — ED Notes (Signed)
Pt reports infected hair follicle to left thigh. Reports increased swelling and erythema to site.

## 2013-12-07 NOTE — ED Notes (Signed)
Attempted to call pt to triage, pt not found in waiting room. Told by registration that pt went to get something to snack on.

## 2013-12-07 NOTE — ED Notes (Signed)
MD at bedside. 

## 2013-12-08 ENCOUNTER — Emergency Department (HOSPITAL_BASED_OUTPATIENT_CLINIC_OR_DEPARTMENT_OTHER)
Admission: EM | Admit: 2013-12-08 | Discharge: 2013-12-08 | Disposition: A | Discharge status: Home / Self Care | Payer: No Typology Code available for payment source | Attending: Emergency Medicine | Admitting: Emergency Medicine

## 2013-12-08 ENCOUNTER — Encounter (HOSPITAL_BASED_OUTPATIENT_CLINIC_OR_DEPARTMENT_OTHER): Payer: Self-pay | Admitting: Emergency Medicine

## 2013-12-08 DIAGNOSIS — Z792 Long term (current) use of antibiotics: Secondary | ICD-10-CM | POA: Insufficient documentation

## 2013-12-08 DIAGNOSIS — R21 Rash and other nonspecific skin eruption: Secondary | ICD-10-CM | POA: Insufficient documentation

## 2013-12-08 DIAGNOSIS — Z880 Allergy status to penicillin: Secondary | ICD-10-CM | POA: Insufficient documentation

## 2013-12-08 DIAGNOSIS — L237 Allergic contact dermatitis due to plants, except food: Secondary | ICD-10-CM

## 2013-12-08 DIAGNOSIS — L255 Unspecified contact dermatitis due to plants, except food: Secondary | ICD-10-CM | POA: Insufficient documentation

## 2013-12-08 NOTE — ED Notes (Signed)
Small area of rash on bilateral arms, itching,  Eyes itching,  No swelling to face,  States required to seek tx from work

## 2013-12-08 NOTE — ED Notes (Signed)
C/o poison ivy to both arms since 530pm

## 2013-12-08 NOTE — ED Provider Notes (Signed)
CSN: [id number]     Arrival date & time 12/08/13  2126 History   First MD Initiated Contact with Patient 12/08/13 2209     This chart was scribed for Junius ArgyleForrest S Tressie Ragin, MD by Arlan OrganAshley Leger, ED Scribe. This patient was seen in room MH02/MH02 and the patient's care was started 10:12 PM.   Chief Complaint  Patient presents with  . Poison Ivy   Patient is a 43 y.o. male presenting with poison ivy. The history is provided by the patient. No language interpreter was used.  Poison Lajoyce Cornersvy This is a new problem. The current episode started yesterday. The problem occurs constantly. The problem has been gradually worsening. Pertinent negatives include no chest pain, no abdominal pain, no headaches and no shortness of breath. Nothing aggravates the symptoms. Nothing relieves the symptoms.    HPI Comments: Russell HallsMichael Nichols is a 43 y.o. male who presents to the Emergency Department complaining of poison ivy to the forearms bilaterally x 1 day that has progressively worsened. Pt admits to being surrounded by poison ivy at his work place. He has applied some Cortisone topical cream to help with itching. He has also showered today prior to arrival. At this time he denies any fever, chills, SOB, or CP. Pt was seen yesterday for folliculitis by me and was started on bactrim. Pt with known allergy to Penicillin. No other concerns this visit.  History reviewed. No pertinent past medical history. History reviewed. No pertinent past surgical history. No family history on file. History  Substance Use Topics  . Smoking status: Never Smoker   . Smokeless tobacco: Current User    Types: Chew  . Alcohol Use: No    Review of Systems  Constitutional: Negative for fever and chills.  HENT: Negative for rhinorrhea and sore throat.   Eyes: Negative for visual disturbance.  Respiratory: Negative for cough and shortness of breath.   Cardiovascular: Negative for chest pain and leg swelling.  Gastrointestinal: Negative for  nausea, vomiting, abdominal pain and diarrhea.  Genitourinary: Negative for dysuria.  Musculoskeletal: Negative for back pain and neck pain.  Skin: Positive for rash.  Neurological: Negative for dizziness, light-headedness and headaches.  Hematological: Does not bruise/bleed easily.  Psychiatric/Behavioral: Negative for confusion.      Allergies  Penicillins  Home Medications   Prior to Admission medications   Medication Sig Start Date End Date Taking? Authorizing Provider  sulfamethoxazole-trimethoprim (SEPTRA DS) 800-160 MG per tablet Take 1 tablet by mouth 2 (two) times daily. 12/07/13   Junius ArgyleForrest S Javonne Louissaint, MD   Triage Vitals: BP 173/99  Pulse 88  Temp(Src) 98.7 F (37.1 C) (Oral)  Resp 18  SpO2 100%   Physical Exam  Nursing note and vitals reviewed. Constitutional: He is oriented to person, place, and time. He appears well-developed and well-nourished.  HENT:  Head: Normocephalic and atraumatic.  Mouth/Throat: Oropharynx is clear and moist.  Eyes: Conjunctivae and EOM are normal. Pupils are equal, round, and reactive to light.  Neck: Normal range of motion. Neck supple.  Cardiovascular: Normal rate, regular rhythm and normal heart sounds.  Exam reveals no gallop and no friction rub.   No murmur heard. Pulmonary/Chest: Effort normal and breath sounds normal. No respiratory distress.  Abdominal: Soft. Bowel sounds are normal. He exhibits no distension. There is no tenderness. There is no rebound and no guarding.  Musculoskeletal: Normal range of motion. He exhibits no edema and no tenderness.  Neurological: He is alert and oriented to person, place, and time.  Skin: Skin is warm and dry. Rash noted.  Scattered erythematous and papular lesions noted on forearms and upper arms bilaterally. Again noted papular lesions on the anterior thighs bilaterally. The follicle with surround erythema on the L anterior thigh remains unchanged.  Psychiatric: He has a normal mood and  affect. His behavior is normal.    ED Course  Procedures (including critical care time)  DIAGNOSTIC STUDIES: Oxygen Saturation is 100% on RA, Normal by my interpretation.    COORDINATION OF CARE: 10:16 PM-Discussed treatment plan with pt at bedside and pt agreed to plan.     Labs Review Labs Reviewed - No data to display  Imaging Review No results found.   EKG Interpretation None      MDM   Final diagnoses:  Poison ivy dermatitis    3:32 PM 43 y.o. male seen here yesterday for folliculitis pw concern for poison ivy on his bilateral arms. He is AF and VSS here. Pt has had poison ivy exposure at work and the lesions are pruritic. The lesions do not have the classic linear/weeping appearance of poison ivy, but I agree w/ him that this is likely the cause given the known exposure. He denies HA, fever, no tick exposures and rash not c/w RMSF. Will rec sx therapy. Severity is not bad enough to warrant steroid taper in my opinion and pt already on abx for folliculitis. Do not want to suppress immune system.   3:36 PM:  I have discussed the diagnosis/risks/treatment options with the patient and believe the pt to be eligible for discharge home to follow-up with pcp as needed. We also discussed returning to the ED immediately if new or worsening sx occur. We discussed the sx which are most concerning (e.g., spreading rash, fever, HA) that necessitate immediate return. Medications administered to the patient during their visit and any new prescriptions provided to the patient are listed below.  Medications given during this visit Medications - No data to display  Discharge Medication List as of 12/08/2013 10:25 PM       I personally performed the services described in this documentation, which was scribed in my presence. The recorded information has been reviewed and is accurate.    Junius Argyle, MD 12/09/13 1536

## 2013-12-08 NOTE — Discharge Instructions (Signed)
You can take benadryl for itching (will make you sleepy) or claritin or zyrtec for itching (wont make you sleepy).

## 2017-02-14 ENCOUNTER — Emergency Department (HOSPITAL_BASED_OUTPATIENT_CLINIC_OR_DEPARTMENT_OTHER)
Admission: EM | Admit: 2017-02-14 | Discharge: 2017-02-14 | Disposition: A | Discharge status: Home / Self Care | Payer: PRIVATE HEALTH INSURANCE | Attending: Emergency Medicine | Admitting: Emergency Medicine

## 2017-02-14 ENCOUNTER — Encounter (HOSPITAL_BASED_OUTPATIENT_CLINIC_OR_DEPARTMENT_OTHER): Payer: Self-pay | Admitting: *Deleted

## 2017-02-14 DIAGNOSIS — Z5321 Procedure and treatment not carried out due to patient leaving prior to being seen by health care provider: Secondary | ICD-10-CM | POA: Diagnosis not present

## 2017-02-14 DIAGNOSIS — R109 Unspecified abdominal pain: Secondary | ICD-10-CM | POA: Diagnosis not present

## 2017-02-14 LAB — URINALYSIS, MICROSCOPIC (REFLEX): RBC / HPF: NONE SEEN RBC/hpf (ref 0–5)

## 2017-02-14 LAB — URINALYSIS, ROUTINE W REFLEX MICROSCOPIC
Bilirubin Urine: NEGATIVE
Glucose, UA: NEGATIVE mg/dL
HGB URINE DIPSTICK: NEGATIVE
Ketones, ur: NEGATIVE mg/dL
NITRITE: NEGATIVE
PROTEIN: NEGATIVE mg/dL
SPECIFIC GRAVITY, URINE: 1.025 (ref 1.005–1.030)
pH: 6 (ref 5.0–8.0)

## 2017-02-14 NOTE — ED Triage Notes (Signed)
Last week he started having abdominal pain, chills, nausea and dry heaves. Afterward he had abdominal bloating. He was seen while at [facility] for symptoms and started on Zithromax. States he has been smoking a vap pen for the past month and thinks he has popcorn lung. He has not had a CXR. Stools are green.

## 2017-11-15 ENCOUNTER — Other Ambulatory Visit: Payer: Self-pay

## 2017-11-15 ENCOUNTER — Encounter (HOSPITAL_COMMUNITY): Payer: Self-pay | Admitting: *Deleted

## 2017-11-15 ENCOUNTER — Inpatient Hospital Stay (HOSPITAL_COMMUNITY)
Admission: EM | Admit: 2017-11-15 | Discharge: 2017-11-18 | DRG: 894 | Discharge status: Against Medical Advice | Payer: PRIVATE HEALTH INSURANCE | Attending: Internal Medicine | Admitting: Internal Medicine

## 2017-11-15 DIAGNOSIS — D696 Thrombocytopenia, unspecified: Secondary | ICD-10-CM

## 2017-11-15 DIAGNOSIS — F1010 Alcohol abuse, uncomplicated: Secondary | ICD-10-CM

## 2017-11-15 DIAGNOSIS — D72829 Elevated white blood cell count, unspecified: Secondary | ICD-10-CM

## 2017-11-15 DIAGNOSIS — F10929 Alcohol use, unspecified with intoxication, unspecified: Secondary | ICD-10-CM | POA: Diagnosis present

## 2017-11-15 DIAGNOSIS — F329 Major depressive disorder, single episode, unspecified: Secondary | ICD-10-CM | POA: Diagnosis present

## 2017-11-15 DIAGNOSIS — Z590 Homelessness: Secondary | ICD-10-CM

## 2017-11-15 DIAGNOSIS — Z880 Allergy status to penicillin: Secondary | ICD-10-CM

## 2017-11-15 DIAGNOSIS — F1994 Other psychoactive substance use, unspecified with psychoactive substance-induced mood disorder: Secondary | ICD-10-CM

## 2017-11-15 DIAGNOSIS — Z915 Personal history of self-harm: Secondary | ICD-10-CM

## 2017-11-15 DIAGNOSIS — F10932 Alcohol use, unspecified with withdrawal with perceptual disturbance: Secondary | ICD-10-CM

## 2017-11-15 DIAGNOSIS — F10230 Alcohol dependence with withdrawal, uncomplicated: Secondary | ICD-10-CM

## 2017-11-15 DIAGNOSIS — F1024 Alcohol dependence with alcohol-induced mood disorder: Secondary | ICD-10-CM

## 2017-11-15 DIAGNOSIS — F10232 Alcohol dependence with withdrawal with perceptual disturbance: Principal | ICD-10-CM | POA: Diagnosis present

## 2017-11-15 DIAGNOSIS — Y906 Blood alcohol level of 120-199 mg/100 ml: Secondary | ICD-10-CM | POA: Diagnosis present

## 2017-11-15 DIAGNOSIS — R000 Tachycardia, unspecified: Secondary | ICD-10-CM

## 2017-11-15 DIAGNOSIS — G4700 Insomnia, unspecified: Secondary | ICD-10-CM | POA: Diagnosis present

## 2017-11-15 DIAGNOSIS — F17290 Nicotine dependence, other tobacco product, uncomplicated: Secondary | ICD-10-CM | POA: Diagnosis present

## 2017-11-15 HISTORY — DX: Alcohol dependence, uncomplicated: F10.20

## 2017-11-15 LAB — COMPREHENSIVE METABOLIC PANEL
ALT: 25 U/L (ref 0–44)
AST: 35 U/L (ref 15–41)
Albumin: 4 g/dL (ref 3.5–5.0)
Alkaline Phosphatase: 88 U/L (ref 38–126)
Anion gap: 14 (ref 5–15)
BILIRUBIN TOTAL: 0.7 mg/dL (ref 0.3–1.2)
BUN: 6 mg/dL (ref 6–20)
CALCIUM: 9 mg/dL (ref 8.9–10.3)
CO2: 25 mmol/L (ref 22–32)
CREATININE: 1.04 mg/dL (ref 0.61–1.24)
Chloride: 100 mmol/L (ref 98–111)
Glucose, Bld: 83 mg/dL (ref 70–99)
Potassium: 3.5 mmol/L (ref 3.5–5.1)
Sodium: 139 mmol/L (ref 135–145)
TOTAL PROTEIN: 8.3 g/dL — AB (ref 6.5–8.1)

## 2017-11-15 LAB — RAPID URINE DRUG SCREEN, HOSP PERFORMED
Amphetamines: NOT DETECTED
BENZODIAZEPINES: NOT DETECTED
COCAINE: NOT DETECTED
OPIATES: NOT DETECTED
Tetrahydrocannabinol: POSITIVE — AB

## 2017-11-15 LAB — CBC
HCT: 43.9 % (ref 39.0–52.0)
Hemoglobin: 15.2 g/dL (ref 13.0–17.0)
MCH: 31.6 pg (ref 26.0–34.0)
MCHC: 34.6 g/dL (ref 30.0–36.0)
MCV: 91.3 fL (ref 78.0–100.0)
PLATELETS: 420 10*3/uL — AB (ref 150–400)
RBC: 4.81 MIL/uL (ref 4.22–5.81)
RDW: 12.8 % (ref 11.5–15.5)
WBC: 13.5 10*3/uL — AB (ref 4.0–10.5)

## 2017-11-15 LAB — DIFFERENTIAL
Basophils Absolute: 0.1 10*3/uL (ref 0.0–0.1)
Basophils Relative: 0 %
EOS PCT: 1 %
Eosinophils Absolute: 0.1 10*3/uL (ref 0.0–0.7)
LYMPHS ABS: 2.3 10*3/uL (ref 0.7–4.0)
LYMPHS PCT: 17 %
MONO ABS: 1.1 10*3/uL — AB (ref 0.1–1.0)
MONOS PCT: 8 %
NEUTROS ABS: 10.2 10*3/uL — AB (ref 1.7–7.7)
Neutrophils Relative %: 74 %

## 2017-11-15 LAB — PROTIME-INR
INR: 0.95
Prothrombin Time: 12.6 seconds (ref 11.4–15.2)

## 2017-11-15 LAB — ETHANOL: ALCOHOL ETHYL (B): 175 mg/dL — AB (ref <10)

## 2017-11-15 MED ORDER — VITAMIN B-1 100 MG PO TABS
100.0000 mg | ORAL_TABLET | Freq: Every day | ORAL | Status: DC
  Administered 2017-11-17: 100 mg via ORAL
  Filled 2017-11-15 (×2): qty 1

## 2017-11-15 MED ORDER — LORAZEPAM 2 MG/ML IJ SOLN
0.0000 mg | Freq: Four times a day (QID) | INTRAMUSCULAR | Status: DC
  Administered 2017-11-15: 3 mg via INTRAVENOUS
  Filled 2017-11-15: qty 2

## 2017-11-15 MED ORDER — LORAZEPAM 2 MG/ML IJ SOLN: 0.0000 mg | Freq: Two times a day (BID) | INTRAMUSCULAR | Status: DC

## 2017-11-15 MED ORDER — THIAMINE HCL 100 MG/ML IJ SOLN
Freq: Once | INTRAVENOUS | Status: AC
  Administered 2017-11-15: 23:00:00 via INTRAVENOUS
  Filled 2017-11-15: qty 1000

## 2017-11-15 MED ORDER — LORAZEPAM 1 MG PO TABS: 0.0000 mg | ORAL_TABLET | Freq: Four times a day (QID) | ORAL | Status: DC

## 2017-11-15 MED ORDER — THIAMINE HCL 100 MG/ML IJ SOLN
100.0000 mg | Freq: Every day | INTRAMUSCULAR | Status: DC
  Administered 2017-11-16: 100 mg via INTRAVENOUS
  Filled 2017-11-15: qty 2

## 2017-11-15 MED ORDER — LORAZEPAM 1 MG PO TABS: 0.0000 mg | ORAL_TABLET | Freq: Two times a day (BID) | ORAL | Status: DC

## 2017-11-15 NOTE — ED Notes (Signed)
ED EKG taken at 23:19.

## 2017-11-15 NOTE — ED Provider Notes (Signed)
[facility]-EMERGENCY DEPT Provider Note   CSN: [id number] Arrival date & time: 11/15/17  1936     History   Chief Complaint Chief Complaint  Patient presents with  . Alcohol Intoxication    HPI Russell Nichols is a 47 y.o. male.  HPI Patient reports he has been abusing alcohol for 2 years.  This was a relapse.  He reports he used to be sober.  He has been drinking about 12-14 beers per day.  He reports he also smokes THC.  He states that he is been getting sick over the past couple of days.  He reports that he is really declined.  He states he is only been able to eat twice this week.  He reports he dry heaves and vomits up thin liquid if he tries to eat.  He reports this morning he felt like he could not get out of bed until he drank alcohol because he felt so unsteady.  He reports he felt terrible and thought he was about to have a seizure.  Because of this he continued to drink on his way to the hospital.  Last drink approximately 4 hours ago.  He reports he has had withdrawal seizure in the past.  Last time probably about a year and a half ago. History reviewed. No pertinent past medical history.  There are no active problems to display for this patient.   History reviewed. No pertinent surgical history.      Home Medications    Prior to Admission medications   Medication Sig Start Date End Date Taking? Authorizing Provider  acetaminophen (TYLENOL) 650 MG CR tablet Take 650 mg by mouth every 8 (eight) hours as needed for pain.   Yes [provider]  escitalopram (LEXAPRO) 20 MG tablet Take 20 mg by mouth daily. 10/16/17  Yes [provider]  QUEtiapine (SEROQUEL) 50 MG tablet Take 25 mg by mouth at bedtime.   Yes [provider]  sulfamethoxazole-trimethoprim (SEPTRA DS) 800-160 MG per tablet Take 1 tablet by mouth 2 (two) times daily. Patient not taking: Reported on 11/15/2017 12/07/13   Purvis SheffieldHarrison, Forrest, MD    Family  History No family history on file.  Social History Social History   Tobacco Use  . Smoking status: Former Games developermoker  . Smokeless tobacco: Current User  Substance Use Topics  . Alcohol use: Yes    Alcohol/week: 50.4 oz    Types: 84 Cans of beer per week  . Drug use: Yes    Types: Marijuana     Allergies   Penicillins   Review of Systems Review of Systems 10 Systems reviewed and are negative for acute change except as noted in the HPI.  Physical Exam Updated Vital Signs BP 118/84   Pulse (!) 111   Temp 98.3 F (36.8 C) (Oral)   Resp 17   Ht 6' (1.829 m)   Wt 83.5 kg (184 lb)   SpO2 90%   BMI 24.95 kg/m   Physical Exam  Constitutional: He is oriented to person, place, and time.  Patient is well-nourished well-developed.  He appears anxious and uncomfortable.  HENT:  Head: Normocephalic and atraumatic.  Diffuse scleral injection.  Pupils symmetric and responsive.  Mucous membranes moist.  Posterior pharynx widely patent.  Eyes: Pupils are equal, round, and reactive to light. EOM are normal.  Neck: Neck supple.  Cardiovascular: Normal rate, regular rhythm, normal heart sounds and intact distal pulses.  Pulmonary/Chest: Effort normal.  Abdominal: Soft. He  exhibits no distension. There is no tenderness. There is no guarding.  Musculoskeletal: Normal range of motion. He exhibits no edema or tenderness.  Neurological: He is alert and oriented to person, place, and time.  Patient is slightly tremulous.  His movements are purposeful however and no motor deficits.  His speech is clear with normal content.  With arms extended patient does have tremor in both hands.  Skin: Skin is warm and dry.  Psychiatric:  Patient is anxious and borderline tearful.     ED Treatments / Results  Labs (all labs ordered are listed, but only abnormal results are displayed) Labs Reviewed  COMPREHENSIVE METABOLIC PANEL - Abnormal; Notable for the following components:      Result Value    Total Protein 8.3 (*)    All other components within normal limits  ETHANOL - Abnormal; Notable for the following components:   Alcohol, Ethyl (B) 175 (*)    All other components within normal limits  CBC - Abnormal; Notable for the following components:   WBC 13.5 (*)    Platelets 420 (*)    All other components within normal limits  RAPID URINE DRUG SCREEN, HOSP PERFORMED - Abnormal; Notable for the following components:   Tetrahydrocannabinol POSITIVE (*)    Barbiturates   (*)    Value: Result not available. Reagent lot number recalled by manufacturer.   All other components within normal limits  DIFFERENTIAL - Abnormal; Notable for the following components:   Neutro Abs 10.2 (*)    Monocytes Absolute 1.1 (*)    All other components within normal limits  PROTIME-INR  LIPASE, BLOOD  URINALYSIS, ROUTINE W REFLEX MICROSCOPIC    EKG EKG Interpretation  Date/Time:  Saturday November 15 2017 23:13:42 EDT Ventricular Rate:  91 PR Interval:  166 QRS Duration: 86 QT Interval:  366 QTC Calculation: 450 R Axis:   48 Text Interpretation:  Normal sinus rhythm Normal ECG no change from previous Confirmed by Arby Barrette [phone]) on 11/15/2017 11:19:44 PM   Radiology No results found.  Procedures Procedures (including critical care time) CRITICAL CARE Performed by: Arby Barrette   Total critical care time: 30 minutes  Critical care time was exclusive of separately billable procedures and treating other patients.  Critical care was necessary to treat or prevent imminent or life-threatening deterioration.  Critical care was time spent personally by me on the following activities: development of treatment plan with patient and/or surrogate as well as nursing, discussions with consultants, evaluation of patient's response to treatment, examination of patient, obtaining history from patient or surrogate, ordering and performing treatments and interventions, ordering and review of  laboratory studies, ordering and review of radiographic studies, pulse oximetry and re-evaluation of patient's condition. Medications Ordered in ED Medications  LORazepam (ATIVAN) injection 0-4 mg (3 mg Intravenous Given 11/15/17 2303)    Or  LORazepam (ATIVAN) tablet 0-4 mg ( Oral See Alternative 11/15/17 2303)  LORazepam (ATIVAN) injection 0-4 mg (has no administration in time range)    Or  LORazepam (ATIVAN) tablet 0-4 mg (has no administration in time range)  thiamine (VITAMIN B-1) tablet 100 mg (has no administration in time range)    Or  thiamine (B-1) injection 100 mg (has no administration in time range)  sodium chloride 0.9 % 1,000 mL with thiamine 100 mg, folic acid 1 mg, multivitamins adult 10 mL infusion ( Intravenous New Bag/Given 11/15/17 2303)     Initial Impression / Assessment and Plan / ED Course  I have reviewed  the triage vital signs and the nursing notes.  Pertinent labs & imaging results that were available during my care of the patient were reviewed by me and considered in my medical decision making (see chart for details).      Final Clinical Impressions(s) / ED Diagnoses   Final diagnoses:  Alcohol withdrawal syndrome with perceptual disturbance (HCC)  ETOH abuse  Is alert and oriented.  He is tremulous.  He has history of up to 12-15 beers per day.  He reports today he had to immediately start drinking because he started to feel like he was so tremulous he could not even get out of bed.  He reports he felt close to having a seizure.  He has had previous withdrawal with seizure over a year ago.  Patient reports that he has tried to really keep himself together and is very ashamed of what has happened with his recidivism.  He reports he felt he had to seek treatment today though because he was so weak, shaky and fearful of developing a seizure that he had to drink to suppress withdrawal to even make it to the emergency department.  Hydration and banana bag initiated.   Ativan as needed with CIWA.  Plan for admission.  ED Discharge Orders    None       Arby Barrette, MD 11/16/17 0005

## 2017-11-15 NOTE — ED Triage Notes (Signed)
Pt wanting ETOH detox. Pt has been drinking on his way into the hospital. Pt reports drinking 12-16 beers a day for the past 2 years.  Pt endorses THC use. Pt reports generalized pain, no appetite.  Pt denies SI.

## 2017-11-15 NOTE — ED Notes (Signed)
Bed: [id number] Expected date:  Expected time:  Means of arrival:  Comments: TCU RM 30

## 2017-11-16 ENCOUNTER — Encounter (HOSPITAL_COMMUNITY): Payer: Self-pay | Admitting: Internal Medicine

## 2017-11-16 DIAGNOSIS — F10929 Alcohol use, unspecified with intoxication, unspecified: Secondary | ICD-10-CM | POA: Diagnosis present

## 2017-11-16 DIAGNOSIS — F10230 Alcohol dependence with withdrawal, uncomplicated: Secondary | ICD-10-CM | POA: Diagnosis not present

## 2017-11-16 DIAGNOSIS — F1024 Alcohol dependence with alcohol-induced mood disorder: Secondary | ICD-10-CM

## 2017-11-16 DIAGNOSIS — F1010 Alcohol abuse, uncomplicated: Secondary | ICD-10-CM

## 2017-11-16 DIAGNOSIS — D696 Thrombocytopenia, unspecified: Secondary | ICD-10-CM

## 2017-11-16 DIAGNOSIS — R000 Tachycardia, unspecified: Secondary | ICD-10-CM | POA: Diagnosis not present

## 2017-11-16 DIAGNOSIS — D72829 Elevated white blood cell count, unspecified: Secondary | ICD-10-CM

## 2017-11-16 LAB — COMPREHENSIVE METABOLIC PANEL
ALBUMIN: 3.3 g/dL — AB (ref 3.5–5.0)
ALK PHOS: 79 U/L (ref 38–126)
ALT: 25 U/L (ref 0–44)
ANION GAP: 10 (ref 5–15)
AST: 34 U/L (ref 15–41)
BUN: 6 mg/dL (ref 6–20)
CALCIUM: 8.6 mg/dL — AB (ref 8.9–10.3)
CO2: 26 mmol/L (ref 22–32)
Chloride: 105 mmol/L (ref 98–111)
Creatinine, Ser: 0.98 mg/dL (ref 0.61–1.24)
GFR calc non Af Amer: >60 mL/min (ref >60)
GLUCOSE: 79 mg/dL (ref 70–99)
Potassium: 3.7 mmol/L (ref 3.5–5.1)
Sodium: 141 mmol/L (ref 135–145)
Total Bilirubin: 0.8 mg/dL (ref 0.3–1.2)
Total Protein: 6.9 g/dL (ref 6.5–8.1)

## 2017-11-16 LAB — LIPASE, BLOOD: Lipase: 47 U/L (ref 11–51)

## 2017-11-16 LAB — CBC
HCT: 42.1 % (ref 39.0–52.0)
HEMOGLOBIN: 14.3 g/dL (ref 13.0–17.0)
MCH: 31.4 pg (ref 26.0–34.0)
MCHC: 34 g/dL (ref 30.0–36.0)
MCV: 92.5 fL (ref 78.0–100.0)
Platelets: 352 10*3/uL (ref 150–400)
RBC: 4.55 MIL/uL (ref 4.22–5.81)
RDW: 13 % (ref 11.5–15.5)
WBC: 10 10*3/uL (ref 4.0–10.5)

## 2017-11-16 LAB — TSH: TSH: 0.717 u[IU]/mL (ref 0.350–4.500)

## 2017-11-16 LAB — HIV ANTIBODY (ROUTINE TESTING W REFLEX): HIV SCREEN 4TH GENERATION: NONREACTIVE

## 2017-11-16 MED ORDER — PANTOPRAZOLE SODIUM 40 MG PO TBEC
40.0000 mg | DELAYED_RELEASE_TABLET | Freq: Two times a day (BID) | ORAL | Status: DC
  Administered 2017-11-16 – 2017-11-18 (×5): 40 mg via ORAL
  Filled 2017-11-16 (×5): qty 1

## 2017-11-16 MED ORDER — LORAZEPAM 2 MG/ML IJ SOLN
1.0000 mg | Freq: Four times a day (QID) | INTRAMUSCULAR | Status: DC | PRN
  Filled 2017-11-16: qty 1

## 2017-11-16 MED ORDER — SODIUM CHLORIDE 0.9% FLUSH: 3.0000 mL | INTRAVENOUS | Status: DC | PRN

## 2017-11-16 MED ORDER — ACETAMINOPHEN 650 MG RE SUPP: 650.0000 mg | Freq: Four times a day (QID) | RECTAL | Status: DC | PRN

## 2017-11-16 MED ORDER — ACETAMINOPHEN 325 MG PO TABS
650.0000 mg | ORAL_TABLET | Freq: Four times a day (QID) | ORAL | Status: DC | PRN
  Administered 2017-11-16: 650 mg via ORAL
  Filled 2017-11-16: qty 2

## 2017-11-16 MED ORDER — LORAZEPAM 2 MG/ML IJ SOLN
0.0000 mg | Freq: Four times a day (QID) | INTRAMUSCULAR | Status: DC
  Administered 2017-11-16 (×4): 2 mg via INTRAVENOUS
  Filled 2017-11-16 (×3): qty 1

## 2017-11-16 MED ORDER — CHLORDIAZEPOXIDE HCL 25 MG PO CAPS: 50.0000 mg | ORAL_CAPSULE | Freq: Every day | ORAL | Status: DC

## 2017-11-16 MED ORDER — LORAZEPAM 2 MG/ML IJ SOLN
2.0000 mg | INTRAMUSCULAR | Status: DC | PRN
  Administered 2017-11-17 (×2): 3 mg via INTRAVENOUS
  Filled 2017-11-16 (×2): qty 2

## 2017-11-16 MED ORDER — SODIUM CHLORIDE 0.9% FLUSH
3.0000 mL | Freq: Two times a day (BID) | INTRAVENOUS | Status: DC
  Administered 2017-11-16 – 2017-11-18 (×4): 3 mL via INTRAVENOUS

## 2017-11-16 MED ORDER — CHLORDIAZEPOXIDE HCL 25 MG PO CAPS
25.0000 mg | ORAL_CAPSULE | Freq: Once | ORAL | Status: AC
  Administered 2017-11-16: 25 mg via ORAL
  Filled 2017-11-16: qty 1

## 2017-11-16 MED ORDER — ENOXAPARIN SODIUM 40 MG/0.4ML ~~LOC~~ SOLN: 40.0000 mg | SUBCUTANEOUS | Status: DC

## 2017-11-16 MED ORDER — CHLORDIAZEPOXIDE HCL 25 MG PO CAPS: 25.0000 mg | ORAL_CAPSULE | Freq: Every day | ORAL | Status: DC

## 2017-11-16 MED ORDER — CHLORDIAZEPOXIDE HCL 25 MG PO CAPS
50.0000 mg | ORAL_CAPSULE | Freq: Once | ORAL | Status: AC
  Administered 2017-11-16: 50 mg via ORAL
  Filled 2017-11-16: qty 2

## 2017-11-16 MED ORDER — LORAZEPAM 2 MG/ML IJ SOLN: 0.0000 mg | Freq: Two times a day (BID) | INTRAMUSCULAR | Status: DC

## 2017-11-16 MED ORDER — SODIUM CHLORIDE 0.9 % IV SOLN
250.0000 mL | INTRAVENOUS | Status: AC | PRN
  Administered 2017-11-16: 250 mL via INTRAVENOUS

## 2017-11-16 MED ORDER — LORAZEPAM 1 MG PO TABS
1.0000 mg | ORAL_TABLET | Freq: Four times a day (QID) | ORAL | Status: DC | PRN
  Administered 2017-11-16: 1 mg via ORAL
  Filled 2017-11-16: qty 1

## 2017-11-16 MED ORDER — SODIUM CHLORIDE 0.9 % IV SOLN
250.0000 mL | INTRAVENOUS | Status: DC | PRN
  Administered 2017-11-16: 250 mL via INTRAVENOUS

## 2017-11-16 NOTE — Progress Notes (Signed)
Pt arrived from the ER on stretcher somewhat lethargic. Slow to respond verbally and when he does speech is garbled. Attributing this to the ETOH & 3mg  Ativan given in the ED. Curled up in fetal position, grumbles at me if we try to remove covers or do anything with him. Therefore, I gave him the chance to sleep with freq observations

## 2017-11-16 NOTE — Progress Notes (Signed)
[facility] PROGRESS NOTE    Progress Note  Russell Nichols  ZOX:[id number] DOB: 03-Jan-1971 DOA: 11/15/2017 PCP: Patient, No Pcp Per     Brief Narrative:   Russell Nichols is an 47 y.o. male past medical history of alcoholism comes in for epigastric discomfort and desire detox.  Assessment/Plan:   Alcohol dependence with uncomplicated withdrawal (HCC): He was started on the Ativan protocol but he still having withdrawal symptoms. Continue oral thiamine and folate. We will put him on scheduled Librium. Epigastric pain and back pain. His lipase on admission was negative.  We will repeat lipase Place n.p.o. continue IV fluids.  Leukocytosis and thrombocytopenia: Likely both reactive now have resolved he has remained afebrile.  Sinus tachycardia: Likely due to withdrawal now resolved.   DVT prophylaxis: scd Family Communication:none Disposition Plan/Barrier to D/C: home in 2 days Code Status:     Code Status Orders  (From admission, onward)        Start     Ordered   11/16/17 0143  Full code  Continuous     11/16/17 0146    Code Status History    Date Active Date Inactive Code Status Order ID Comments User Context   11/15/2017 2245 11/16/2017 0146 Full Code [id number]  Arby Barrette, MD ED        IV Access:    Peripheral IV   Procedures and diagnostic studies:   No results found.   Medical Consultants:    None.  Anti-Infectives:   None  Subjective:    Russell Nichols relates he continues to be nausea with diffuse pain.  Objective:    Vitals:   11/16/17 0228 11/16/17 0249 11/16/17 0256 11/16/17 0520  BP: 101/78  110/77 (!) 132/91  Pulse: 79  76 75  Resp: 18 20 20  (!) 22  Temp:   97.9 F (36.6 C) 97.7 F (36.5 C)  TempSrc: Oral  Oral Oral  SpO2: 96%  96% 95%  Weight:    83.7 kg (184 lb 8.4 oz)  Height:    6' (1.829 m)   No intake or output data in the 24 hours ending 11/16/17 0831 Filed Weights   11/15/17 2043 11/16/17 0520    Weight: 83.5 kg (184 lb) 83.7 kg (184 lb 8.4 oz)    Exam: General exam: In no acute distress. Respiratory system: Good air movement and clear to auscultation. Cardiovascular system: S1 & S2 heard, RRR. No JVD, murmurs, rubs, gallops or clicks.  Gastrointestinal system: Abdomen is nondistended, soft and nontender.  Central nervous system: Alert and oriented. No focal neurological deficits. Extremities: No pedal edema. Skin: No rashes, lesions or ulcers Psychiatry: Judgement and insight appear normal. Mood & affect appropriate.    Data Reviewed:    Labs: Basic Metabolic Panel: Recent Labs  Lab 11/15/17 2106 11/16/17 0510  NA 139 141  K 3.5 3.7  CL 100 105  CO2 25 26  GLUCOSE 83 79  BUN 6 6  CREATININE 1.04 0.98  CALCIUM 9.0 8.6*   GFR Estimated Creatinine Clearance: 103.4 mL/min (by C-G formula based on SCr of 0.98 mg/dL). Liver Function Tests: Recent Labs  Lab 11/15/17 2106 11/16/17 0510  AST 35 34  ALT 25 25  ALKPHOS 88 79  BILITOT 0.7 0.8  PROT 8.3* 6.9  ALBUMIN 4.0 3.3*   Recent Labs  Lab 11/15/17 2232  LIPASE 47   No results for input(s): AMMONIA in the last 168 hours. Coagulation profile Recent Labs  Lab 11/15/17 2232  INR 0.95  CBC: Recent Labs  Lab 11/15/17 2106 11/15/17 2232 11/16/17 0510  WBC 13.5*  --  10.0  NEUTROABS  --  10.2*  --   HGB 15.2  --  14.3  HCT 43.9  --  42.1  MCV 91.3  --  92.5  PLT 420*  --  352   Cardiac Enzymes: No results for input(s): CKTOTAL, CKMB, CKMBINDEX, TROPONINI in the last 168 hours. BNP (last 3 results) No results for input(s): PROBNP in the last 8760 hours. CBG: No results for input(s): GLUCAP in the last 168 hours. D-Dimer: No results for input(s): DDIMER in the last 72 hours. Hgb A1c: No results for input(s): HGBA1C in the last 72 hours. Lipid Profile: No results for input(s): CHOL, HDL, LDLCALC, TRIG, CHOLHDL, LDLDIRECT in the last 72 hours. Thyroid function studies: Recent Labs     11/16/17 0510  TSH 0.717   Anemia work up: No results for input(s): VITAMINB12, FOLATE, FERRITIN, TIBC, IRON, RETICCTPCT in the last 72 hours. Sepsis Labs: Recent Labs  Lab 11/15/17 2106 11/16/17 0510  WBC 13.5* 10.0   Microbiology No results found for this or any previous visit (from the past 240 hour(s)).   Medications:   . [START ON 11/18/2017] chlordiazePOXIDE  25 mg Oral Daily  . [START ON 11/17/2017] chlordiazePOXIDE  50 mg Oral Daily  . enoxaparin (LOVENOX) injection  40 mg Subcutaneous Q24H  . LORazepam  0-4 mg Intravenous Q6H   Followed by  . [START ON 11/18/2017] LORazepam  0-4 mg Intravenous Q12H  . sodium chloride flush  3 mL Intravenous Q12H  . thiamine  100 mg Oral Daily   Or  . thiamine  100 mg Intravenous Daily   Continuous Infusions: . sodium chloride 250 mL (11/16/17 0700)      LOS: 0 days   Russell Nichols  [facility] Pager [phone]  *Please refer to [email or web address], password [email or web address] to get updated schedule on who will round on this patient, as hospitalists switch teams weekly. If 7PM-7AM, please contact night-coverage at [email or web address], password [email or web address] for any overnight needs.  11/16/2017, 8:31 AM

## 2017-11-16 NOTE — ED Notes (Signed)
Pt's friend Laren EvertsMichael Grimsley, who [place] the beach and has brought pt here has brought pt's wallet, cellphone, and vape home w/him.  His phone number is [phone]

## 2017-11-16 NOTE — H&P (Signed)
[facility] H&P   Patient Demographics:    Russell Nichols, is a 47 y.o. male  MRN: [id number]   DOB - Nov 19, 1970  Admit Date - 11/15/2017  Outpatient Primary MD for the patient is Patient, No Pcp Per  Referring MD/NP/PA:  Hoover Browns  Outpatient Specialists:   Patient coming from: home  Chief Complaint  Patient presents with  . Alcohol Intoxication      HPI:    Russell Nichols  is a 47 y.o. male, w alcoholism apparently c/o some epigastric discomfort and desire for detox. Pt typically drinks about 12-14 beers per day per his friend.    In ED,  Na 139, K 3.5, Bun 6, Creatinine 1.04 Ast 35, Alt 25 Alk phos 88 T. Bili 0.7 Lipase 47  ETOH  175  Urine THC +  Wbc 13.5, Hgb 15.2, Plt 420  Pt will be admitted for desire for etoh detox.      Review of systems:    In addition to the HPI above,  No Fever-chills, No Headache, No changes with Vision or hearing, No problems swallowing food or Liquids, No Chest pain, Cough or Shortness of Breath, No Nausea or Vommitting, Bowel movements are regular, No Blood in stool or Urine, No dysuria, No new skin rashes or bruises, No new joints pains-aches,  No new weakness, tingling, numbness in any extremity, No recent weight gain or loss, No polyuria, polydypsia or polyphagia, No significant Mental Stressors.  A full 10 point Review of Systems was done, except as stated above, all other Review of Systems were negative.   With Past History of the following :    Past Medical History:  Diagnosis Date  . Alcoholism ([place])       History reviewed. No pertinent surgical history.    Social History:     Social History   Tobacco Use  . Smoking status: Former Research scientist (life sciences)  . Smokeless tobacco: Current User  Substance Use Topics  . Alcohol use: Yes    Alcohol/week: 50.4 oz    Types: 84 Cans of beer per week     Lives - at  home  Mobility - walks by self   Family History :     Family History  Family history unknown: Yes   Unknown, pt somnolent unable to provide history    Home Medications:   Prior to Admission medications   Medication Sig Start Date End Date Taking? Authorizing Provider  acetaminophen (TYLENOL) 650 MG CR tablet Take 650 mg by mouth every 8 (eight) hours as needed for pain.   Yes [provider]  escitalopram (LEXAPRO) 20 MG tablet Take 20 mg by mouth daily. 10/16/17  Yes [provider]  QUEtiapine (SEROQUEL) 50 MG tablet Take 25 mg by mouth at bedtime.   Yes [provider]  sulfamethoxazole-trimethoprim (SEPTRA DS) 800-160 MG per tablet Take 1 tablet by mouth 2 (two)  times daily. Patient not taking: Reported on 11/15/2017 12/07/13   Pamella Pert, MD     Allergies:     Allergies  Allergen Reactions  . Penicillins     Has patient had a PCN reaction causing immediate rash, facial/tongue/throat swelling, SOB or lightheadedness with hypotension: Y Has patient had a PCN reaction causing severe rash involving mucus membranes or skin necrosis: Y Has patient had a PCN reaction that required hospitalization: N Has patient had a PCN reaction occurring within the last 10 years: N If all of the above answers are "NO", then may proceed with Cephalosporin use.      Physical Exam:   Vitals  Blood pressure 118/84, pulse (!) 111, temperature 98.3 F (36.8 C), temperature source Oral, resp. rate 17, height 6' (1.829 m), weight 83.5 kg (184 lb), SpO2 90 %.   1. General lying in bed in NAD,  2. Normal affect and insight, Not Suicidal or Homicidal, Somnolent  3. No F.N deficits, ALL C.Nerves Intact, Strength 5/5 all 4 extremities, Sensation intact all 4 extremities, Plantars down going.  4. Ears and Eyes appear Normal, Conjunctivae clear, PERRLA. Moist Oral Mucosa.  5. Supple Neck, No JVD, No cervical lymphadenopathy appriciated, No Carotid Bruits.  6.  Symmetrical Chest wall movement, Good air movement bilaterally, CTAB.  7. RRR, No Gallops, Rubs or Murmurs, No Parasternal Heave.  8. Positive Bowel Sounds, Abdomen Soft, No tenderness, No organomegaly appriciated,No rebound -guarding or rigidity.  9.  No Cyanosis, Normal Skin Turgor, No Skin Rash or Bruise.  10. Good muscle tone,  joints appear normal , no effusions, Normal ROM.  11. No Palpable Lymph Nodes in Neck or Axillae      Data Review:    CBC Recent Labs  Lab 11/15/17 2106 11/15/17 2232  WBC 13.5*  --   HGB 15.2  --   HCT 43.9  --   PLT 420*  --   MCV 91.3  --   MCH 31.6  --   MCHC 34.6  --   RDW 12.8  --   LYMPHSABS  --  2.3  MONOABS  --  1.1*  EOSABS  --  0.1  BASOSABS  --  0.1   ------------------------------------------------------------------------------------------------------------------  Chemistries  Recent Labs  Lab 11/15/17 2106  NA 139  K 3.5  CL 100  CO2 25  GLUCOSE 83  BUN 6  CREATININE 1.04  CALCIUM 9.0  AST 35  ALT 25  ALKPHOS 88  BILITOT 0.7   ------------------------------------------------------------------------------------------------------------------ estimated creatinine clearance is 97.4 mL/min (by C-G formula based on SCr of 1.04 mg/dL). ------------------------------------------------------------------------------------------------------------------ No results for input(s): TSH, T4TOTAL, T3FREE, THYROIDAB in the last 72 hours.  Invalid input(s): FREET3  Coagulation profile Recent Labs  Lab 11/15/17 2232  INR 0.95   ------------------------------------------------------------------------------------------------------------------- No results for input(s): DDIMER in the last 72 hours. -------------------------------------------------------------------------------------------------------------------  Cardiac Enzymes No results for input(s): CKMB, TROPONINI, MYOGLOBIN in the last 168 hours.  Invalid input(s):  CK ------------------------------------------------------------------------------------------------------------------ No results found for: BNP   ---------------------------------------------------------------------------------------------------------------  Urinalysis    Component Value Date/Time   COLORURINE YELLOW 02/14/2017 [place] 02/14/2017 1407   LABSPEC 1.025 02/14/2017 1407   PHURINE 6.0 02/14/2017 [place] 02/14/2017 1407   HGBUR NEGATIVE 02/14/2017 [place] 02/14/2017 1407   [place] 02/14/2017 1407   PROTEINUR NEGATIVE 02/14/2017 1407   UROBILINOGEN 0.2 11/20/2010 0449   NITRITE NEGATIVE 02/14/2017 1407   LEUKOCYTESUR TRACE (A) 02/14/2017 1407    ----------------------------------------------------------------------------------------------------------------  Imaging Results:    No results found.     Assessment & Plan:    Principal Problem:   Alcohol dependence with uncomplicated withdrawal (HCC) Active Problems:   Tachycardia    Alcohol dep Librium 28m po qday x 1 days then 234mpo qday x 1 day CIWA protocol Check cmp in am  Tachycardia ? Secondary to etoh intoxication If not improving with hydration then consider TSH and cardiac echo  Leukocytosis check cbc in am   Please obtain psychiatry consult per friend    DVT Prophylaxis Lovenox - SCDs   AM Labs Ordered, also please review Full Orders  Family Communication: Admission, patients condition and plan of care including tests being ordered have been discussed with the patient  who indicate understanding and agree with the plan and Code Status.  Code Status  FULL CODE  Likely DC to  home  Condition GUARDED    Consults called: none  Admission status:  inpatient  Time spent in minutes : 60   JaJani Gravel.D on 11/16/2017 at 1:21 AM  Between 7am to 7pm - Pager - [phone] 7pm go to [email or web address] - password  [facility] - Office  [phone]

## 2017-11-16 NOTE — Progress Notes (Signed)
Telemetry reattached. Patient still with tremors and slurred speech but able to stay in bed. Thought he had been here three days. Frequent verbal contacts provided to reassure safety. Wants to talk to Mental Health about life issues and medications. "I need to be put away forever." Agrees that he is safe here and can wait the night and talk to them tomorrow.  Donalee Citrinavid Dietrich Samuelson RN

## 2017-11-17 DIAGNOSIS — D72829 Elevated white blood cell count, unspecified: Secondary | ICD-10-CM | POA: Diagnosis not present

## 2017-11-17 DIAGNOSIS — F1010 Alcohol abuse, uncomplicated: Secondary | ICD-10-CM | POA: Diagnosis not present

## 2017-11-17 DIAGNOSIS — F10232 Alcohol dependence with withdrawal with perceptual disturbance: Secondary | ICD-10-CM | POA: Diagnosis not present

## 2017-11-17 DIAGNOSIS — F10230 Alcohol dependence with withdrawal, uncomplicated: Secondary | ICD-10-CM | POA: Diagnosis not present

## 2017-11-17 LAB — URINALYSIS, ROUTINE W REFLEX MICROSCOPIC
BILIRUBIN URINE: NEGATIVE
GLUCOSE, UA: NEGATIVE mg/dL
HGB URINE DIPSTICK: NEGATIVE
KETONES UR: NEGATIVE mg/dL
Leukocytes, UA: NEGATIVE
Nitrite: NEGATIVE
PH: 7 (ref 5.0–8.0)
PROTEIN: NEGATIVE mg/dL
Specific Gravity, Urine: 1.005 (ref 1.005–1.030)

## 2017-11-17 LAB — MRSA PCR SCREENING: MRSA BY PCR: NEGATIVE

## 2017-11-17 MED ORDER — LORAZEPAM 2 MG/ML IJ SOLN: 2.0000 mg | Freq: Once | INTRAMUSCULAR | Status: DC

## 2017-11-17 MED ORDER — CHLORDIAZEPOXIDE HCL 5 MG PO CAPS: 25.0000 mg | ORAL_CAPSULE | Freq: Every day | ORAL | Status: DC

## 2017-11-17 MED ORDER — ONDANSETRON 4 MG PO TBDP: 4.0000 mg | ORAL_TABLET | Freq: Four times a day (QID) | ORAL | Status: DC | PRN

## 2017-11-17 MED ORDER — QUETIAPINE FUMARATE 25 MG PO TABS
25.0000 mg | ORAL_TABLET | Freq: Every day | ORAL | Status: DC
  Administered 2017-11-17 (×2): 25 mg via ORAL
  Filled 2017-11-17 (×2): qty 1

## 2017-11-17 MED ORDER — CHLORDIAZEPOXIDE HCL 5 MG PO CAPS
25.0000 mg | ORAL_CAPSULE | Freq: Four times a day (QID) | ORAL | Status: AC
  Administered 2017-11-17 (×3): 25 mg via ORAL
  Filled 2017-11-17: qty 5
  Filled 2017-11-17: qty 1
  Filled 2017-11-17: qty 5

## 2017-11-17 MED ORDER — HALOPERIDOL LACTATE 5 MG/ML IJ SOLN
2.0000 mg | Freq: Four times a day (QID) | INTRAMUSCULAR | Status: DC | PRN
  Administered 2017-11-18: 2 mg via INTRAVENOUS
  Filled 2017-11-17 (×2): qty 1

## 2017-11-17 MED ORDER — CHLORDIAZEPOXIDE HCL 5 MG PO CAPS
25.0000 mg | ORAL_CAPSULE | Freq: Three times a day (TID) | ORAL | Status: DC
  Administered 2017-11-18 (×2): 25 mg via ORAL
  Filled 2017-11-17 (×2): qty 5

## 2017-11-17 MED ORDER — CHLORDIAZEPOXIDE HCL 5 MG PO CAPS: 25.0000 mg | ORAL_CAPSULE | ORAL | Status: DC

## 2017-11-17 MED ORDER — ZIPRASIDONE MESYLATE 20 MG IM SOLR
20.0000 mg | Freq: Once | INTRAMUSCULAR | Status: AC
  Administered 2017-11-17: 20 mg via INTRAMUSCULAR
  Filled 2017-11-17 (×2): qty 20

## 2017-11-17 MED ORDER — CHLORDIAZEPOXIDE HCL 5 MG PO CAPS: 25.0000 mg | ORAL_CAPSULE | Freq: Four times a day (QID) | ORAL | Status: DC | PRN

## 2017-11-17 MED ORDER — LORAZEPAM 2 MG/ML IJ SOLN
2.0000 mg | Freq: Four times a day (QID) | INTRAMUSCULAR | Status: DC | PRN
  Administered 2017-11-17 – 2017-11-18 (×3): 2 mg via INTRAVENOUS
  Filled 2017-11-17 (×3): qty 1

## 2017-11-17 MED ORDER — THIAMINE HCL 100 MG/ML IJ SOLN
100.0000 mg | Freq: Once | INTRAMUSCULAR | Status: AC
  Administered 2017-11-17: 100 mg via INTRAMUSCULAR

## 2017-11-17 MED ORDER — ADULT MULTIVITAMIN W/MINERALS CH
1.0000 | ORAL_TABLET | Freq: Every day | ORAL | Status: DC
  Administered 2017-11-17 – 2017-11-18 (×2): 1 via ORAL
  Filled 2017-11-17 (×2): qty 1

## 2017-11-17 MED ORDER — VITAMIN B-1 100 MG PO TABS
100.0000 mg | ORAL_TABLET | Freq: Every day | ORAL | Status: DC
  Administered 2017-11-18: 100 mg via ORAL
  Filled 2017-11-17: qty 1

## 2017-11-17 MED ORDER — THIAMINE HCL 100 MG/ML IJ SOLN
Freq: Once | INTRAVENOUS | Status: AC
  Administered 2017-11-17: 02:00:00 via INTRAVENOUS
  Filled 2017-11-17: qty 1000

## 2017-11-17 MED ORDER — ESCITALOPRAM OXALATE 20 MG PO TABS
20.0000 mg | ORAL_TABLET | Freq: Every day | ORAL | Status: DC
  Administered 2017-11-17 – 2017-11-18 (×2): 20 mg via ORAL
  Filled 2017-11-17 (×2): qty 1

## 2017-11-17 MED ORDER — CHLORDIAZEPOXIDE HCL 25 MG PO CAPS
25.0000 mg | ORAL_CAPSULE | Freq: Once | ORAL | Status: AC
  Administered 2017-11-17: 25 mg via ORAL
  Filled 2017-11-17: qty 1

## 2017-11-17 MED ORDER — HYDROXYZINE HCL 25 MG PO TABS
25.0000 mg | ORAL_TABLET | Freq: Four times a day (QID) | ORAL | Status: DC | PRN
  Administered 2017-11-18: 25 mg via ORAL
  Filled 2017-11-17: qty 1

## 2017-11-17 MED ORDER — LORAZEPAM 2 MG/ML IJ SOLN
1.0000 mg | Freq: Once | INTRAMUSCULAR | Status: AC
  Administered 2017-11-17: 1 mg via INTRAMUSCULAR
  Filled 2017-11-17: qty 1

## 2017-11-17 MED ORDER — LOPERAMIDE HCL 2 MG PO CAPS: 2.0000 mg | ORAL_CAPSULE | ORAL | Status: DC | PRN

## 2017-11-17 NOTE — Progress Notes (Signed)
[facility] PROGRESS NOTE    Progress Note  Russell HallsMichael Makara  ZOX:[id number] DOB: 1970/11/17 DOA: 11/15/2017 PCP: Patient, No Pcp Per     Brief Narrative:   Russell Nichols is an 47 y.o. male past medical history of alcoholism comes in for epigastric discomfort and desire detox.  Assessment/Plan:   Alcohol dependence with uncomplicated withdrawal (HCC): After giving him extra doses of benzodiazepine he improved during the day but became combative and agitated overnight had to be given extra doses of Ativan and Geodon.  This morning he is very sleepy. We will switch him to Librium protocol, and Ativan as needed for agitation. Also given Haldol if needed, check a 12-lead EKG. Unremarkable.  We will give him a diet.  Leukocytosis and thrombocytopenia: Likely both reactive now have resolved he has remained afebrile.  Sinus tachycardia: Likely due to withdrawal now resolved.   DVT prophylaxis: scd Family Communication:none Disposition Plan/Barrier to D/C: home in 2 days Code Status:     Code Status Orders  (From admission, onward)        Start     Ordered   11/16/17 0143  Full code  Continuous     11/16/17 0146    Code Status History    Date Active Date Inactive Code Status Order ID Comments User Context   11/15/2017 2245 11/16/2017 0146 Full Code [id number]  Arby BarrettePfeiffer, Marcy, MD ED        IV Access:    Peripheral IV   Procedures and diagnostic studies:   No results found.   Medical Consultants:    None.  Anti-Infectives:   None  Subjective:    Russell HallsMichael Bessler he is lying comfortably in bed sleepy hard to wake up he has no new pain on abdomen on physical examination.  Objective:    Vitals:   11/17/17 0400 11/17/17 0500 11/17/17 0600 11/17/17 0700  BP: 117/72 138/85 134/89 119/75  Pulse: 69 72 69 73  Resp: (!) 27 12    Temp:      TempSrc:      SpO2: 97% 99% 97% 98%  Weight:      Height:        Intake/Output Summary (Last 24 hours) at  11/17/2017 0757 Last data filed at 11/17/2017 0130 Gross per 24 hour  Intake 283 ml  Output 4 ml  Net 279 ml   Filed Weights   11/15/17 2043 11/16/17 0520 11/17/17 0000  Weight: 83.5 kg (184 lb) 83.7 kg (184 lb 8.4 oz) 81.1 kg (178 lb 12.7 oz)    Exam: General exam: In no acute distress. Respiratory system: Good air movement and clear to auscultation. Cardiovascular system: S1 & S2 heard, RRR.  Gastrointestinal system: Abdomen is nondistended, soft and nontender.  Central nervous system: Alert and oriented. No focal neurological deficits. Extremities: No pedal edema. Skin: No rashes, lesions or ulcers Psychiatry: Judgement and insight appear normal. Mood & affect appropriate.    Data Reviewed:    Labs: Basic Metabolic Panel: Recent Labs  Lab 11/15/17 2106 11/16/17 0510  NA 139 141  K 3.5 3.7  CL 100 105  CO2 25 26  GLUCOSE 83 79  BUN 6 6  CREATININE 1.04 0.98  CALCIUM 9.0 8.6*   GFR Estimated Creatinine Clearance: 103.4 mL/min (by C-G formula based on SCr of 0.98 mg/dL). Liver Function Tests: Recent Labs  Lab 11/15/17 2106 11/16/17 0510  AST 35 34  ALT 25 25  ALKPHOS 88 79  BILITOT 0.7 0.8  PROT 8.3* 6.9  ALBUMIN 4.0 3.3*   Recent Labs  Lab 11/15/17 2232  LIPASE 47   No results for input(s): AMMONIA in the last 168 hours. Coagulation profile Recent Labs  Lab 11/15/17 2232  INR 0.95    CBC: Recent Labs  Lab 11/15/17 2106 11/15/17 2232 11/16/17 0510  WBC 13.5*  --  10.0  NEUTROABS  --  10.2*  --   HGB 15.2  --  14.3  HCT 43.9  --  42.1  MCV 91.3  --  92.5  PLT 420*  --  352   Cardiac Enzymes: No results for input(s): CKTOTAL, CKMB, CKMBINDEX, TROPONINI in the last 168 hours. BNP (last 3 results) No results for input(s): PROBNP in the last 8760 hours. CBG: No results for input(s): GLUCAP in the last 168 hours. D-Dimer: No results for input(s): DDIMER in the last 72 hours. Hgb A1c: No results for input(s): HGBA1C in the last 72  hours. Lipid Profile: No results for input(s): CHOL, HDL, LDLCALC, TRIG, CHOLHDL, LDLDIRECT in the last 72 hours. Thyroid function studies: Recent Labs    11/16/17 0510  TSH 0.717   Anemia work up: No results for input(s): VITAMINB12, FOLATE, FERRITIN, TIBC, IRON, RETICCTPCT in the last 72 hours. Sepsis Labs: Recent Labs  Lab 11/15/17 2106 11/16/17 0510  WBC 13.5* 10.0   Microbiology Recent Results (from the past 240 hour(s))  MRSA PCR Screening     Status: None   Collection Time: 11/17/17  4:52 AM  Result Value Ref Range Status   MRSA by PCR NEGATIVE NEGATIVE Final    Comment:        The GeneXpert MRSA Assay (FDA approved for NASAL specimens only), is one component of a comprehensive MRSA colonization surveillance program. It is not intended to diagnose MRSA infection nor to guide or monitor treatment for MRSA infections. Performed at [facility], [address], [place], [place] [address]      Medications:   . [START ON 11/18/2017] chlordiazePOXIDE  25 mg Oral Daily  . escitalopram  20 mg Oral Daily  . pantoprazole  40 mg Oral BID  . QUEtiapine  25 mg Oral QHS  . sodium chloride flush  3 mL Intravenous Q12H  . thiamine  100 mg Oral Daily   Or  . thiamine  100 mg Intravenous Daily   Continuous Infusions: . sodium chloride 250 mL (11/16/17 1312)      LOS: 1 day   Marinda Elk  [facility] Pager [phone]  *Please refer to [email or web address], password [email or web address] to get updated schedule on who will round on this patient, as hospitalists switch teams weekly. If 7PM-7AM, please contact night-coverage at [email or web address], password [email or web address] for any overnight needs.  11/17/2017, 7:57 AM

## 2017-11-17 NOTE — Progress Notes (Signed)
Supervisor involved with decision with Dr Selena BattenKim to transfer Patient to ICU for closer monitoring and treatment.  Telemetry monitor reapplied multiple times this shift.  Patient  had been crying and distraught that he could not contact his friends who could help him. Patient described nightmares and seeing multiple people (not present) and failed to cope on a general floor. He had intentionally pulled his IV out and monitor leads off and had attempted to dress himself to leave.  Patient agreed to go to ICU and compiled with a safe ride on his bed to the ICU. Report given to Staff.

## 2017-11-17 NOTE — Progress Notes (Signed)
Pt was educated and refused bed alarm. Pt states the noise triggers his PTSD.

## 2017-11-17 NOTE — Progress Notes (Signed)
Patient transported with RN to room 1619 along with telesitter monitor; No signs of distress noted.

## 2017-11-17 NOTE — Progress Notes (Signed)
Around 0400 the patient became really agitated and combative. Patient was awakened by the RN because he was wet. It wasn't clear if he was drenched in sweat or urine. After awakened the patient agreed to have his gown and blankets changed. When RN came back with clean linen, RN assisted patient with gown change. Patient suddenly became confused and yelled he wanted to go home while climbing out of bed. Patient pulled out/off all tubes and lines while undressing. Patient became very aggressive climbing over the other side of the bed to grab clothing in his bag. Other staff and security arrived. MD ordered Geodon IM and Ativan IM. Patient now resting, bed alarm on, floor mats ordered, vitals stable. Will continue to monitor patient.

## 2017-11-18 DIAGNOSIS — F10230 Alcohol dependence with withdrawal, uncomplicated: Secondary | ICD-10-CM | POA: Diagnosis not present

## 2017-11-18 DIAGNOSIS — F1099 Alcohol use, unspecified with unspecified alcohol-induced disorder: Secondary | ICD-10-CM

## 2017-11-18 DIAGNOSIS — D696 Thrombocytopenia, unspecified: Secondary | ICD-10-CM | POA: Diagnosis not present

## 2017-11-18 DIAGNOSIS — Z560 Unemployment, unspecified: Secondary | ICD-10-CM

## 2017-11-18 DIAGNOSIS — F1994 Other psychoactive substance use, unspecified with psychoactive substance-induced mood disorder: Secondary | ICD-10-CM | POA: Diagnosis not present

## 2017-11-18 DIAGNOSIS — Z87891 Personal history of nicotine dependence: Secondary | ICD-10-CM

## 2017-11-18 DIAGNOSIS — D72829 Elevated white blood cell count, unspecified: Secondary | ICD-10-CM | POA: Diagnosis not present

## 2017-11-18 DIAGNOSIS — G4700 Insomnia, unspecified: Secondary | ICD-10-CM

## 2017-11-18 DIAGNOSIS — F419 Anxiety disorder, unspecified: Secondary | ICD-10-CM

## 2017-11-18 DIAGNOSIS — F10232 Alcohol dependence with withdrawal with perceptual disturbance: Principal | ICD-10-CM

## 2017-11-18 DIAGNOSIS — Z590 Homelessness: Secondary | ICD-10-CM

## 2017-11-18 DIAGNOSIS — F1010 Alcohol abuse, uncomplicated: Secondary | ICD-10-CM | POA: Diagnosis not present

## 2017-11-18 DIAGNOSIS — Z818 Family history of other mental and behavioral disorders: Secondary | ICD-10-CM

## 2017-11-18 DIAGNOSIS — F1290 Cannabis use, unspecified, uncomplicated: Secondary | ICD-10-CM

## 2017-11-18 MED ORDER — HALOPERIDOL LACTATE 5 MG/ML IJ SOLN
5.0000 mg | Freq: Four times a day (QID) | INTRAMUSCULAR | Status: DC | PRN
  Filled 2017-11-18: qty 1

## 2017-11-18 NOTE — Progress Notes (Signed)
Pt was agitated and stated the telesitter was making his anxiety and PTSD worse.  Pt stated that if alarms kept going off in his room from the telesitter monitor that there was going to be blood everywhere.  The pt was educated by myself and the charge rn about the purpose of the telesitter for his own safety.  The pt then stated "a man in my mental state can only take so much of this before I want to die".  The charge rn then proceeded to ask the pt if he was having suicidal ideations, the pt stated " if i wanted to kill himself i would have done it by now but yall are really pushing me ".  Pt was then taken off of telemonitoring and given a safety sitter and placed on suicide precautions.

## 2017-11-18 NOTE — Progress Notes (Signed)
PT has been very irritable and agitated .  Pt refused bed alarm. Pt was educated by primary nurse Anesha,RN. Pt stated " the sound of the bed alarm triggers PTSD". The primary nurse, the pt and I agreed that the bed alarm would be off if he called when he wanted to get up. The telesitter called numerous of times stating that the pt is up out the bed. Primary nurse Bari EdwardAnesha and I went back to discuss the importance of the bed alarm.  The pt then stated "a man in my mental state can only take so much of this before I want to die".  I then proceeded to ask the pt if he was having suicidal ideations, the pt stated " if i wanted to kill himself i would have done it by now but yall are really pushing me, you keep talking about how you are trying to protect me but nobody as taken my belongings or these cords out of my room so I wont kill myself".  I again asked the pt if he was thinking of harming his self, and he did not answer.   I then called the Ac to make her aware. We ordered this pt a low bed with mats, paged [facility] to make aware of pt statement and pt was then placed on suicide precautions.

## 2017-11-18 NOTE — Progress Notes (Addendum)
[facility] PROGRESS NOTE    Progress Note  Russell Nichols  ZOX:[id number] DOB: [birth year] DOA: 11/15/2017 PCP: Patient, No Pcp Per     Brief Narrative:   Russell Nichols is an 47 y.o. male past medical history of alcoholism comes in for epigastric discomfort and desire detox.  Assessment/Plan:   Alcohol dependence with uncomplicated withdrawal [facility]): Patient is threating and verbally abusive, he and I quote " if you discharge me I will make sure the psychiatrist sees me".  Cont. Librium protocol. Also given Haldol if needed, check a 12-lead EKG.  Leukocytosis and thrombocytopenia: Likely both reactive now have resolved he has remained afebrile.  Sinus tachycardia: Likely due to withdrawal now resolved.  Hallucination: Patient relates that in the past he has been having conversation with people who do not exist and threating to harm himself. Consult Psyq.   DVT prophylaxis: scd Family Communication:none Disposition Plan/Barrier to D/C: Unable to determine Code Status:     Code Status Orders  (From admission, onward)        Start     Ordered   11/16/17 0143  Full code  Continuous     11/16/17 0146    Code Status History    Date Active Date Inactive Code Status Order ID Comments User Context   11/15/2017 2245 11/16/2017 0146 Full Code [id number]  Arby Barrette, MD ED        IV Access:    Peripheral IV   Procedures and diagnostic studies:   No results found.   Medical Consultants:    None.  Anti-Infectives:   None  Subjective:    Russell Nichols is with labile emotion, relating that he is not right, talking to people and with severe panic attacks in the past   Objective:    Vitals:   11/17/17 1206 11/17/17 1206 11/17/17 1320 11/17/17 2057  BP: 119/67 (!) 133/95 118/68 (!) 145/96  Pulse: 75 74 76 79  Resp:  14 14 17   Temp:  97.7 F (36.5 C) 97.7 F (36.5 C) 98 F (36.7 C)  TempSrc:  Oral Oral Oral  SpO2:  98% 94% 100%    Weight:      Height:        Intake/Output Summary (Last 24 hours) at 11/18/2017 1129 Last data filed at 11/17/2017 2146 Gross per 24 hour  Intake 823 ml  Output -  Net 823 ml   Filed Weights   11/15/17 2043 11/16/17 0520 11/17/17 0000  Weight: 83.5 kg (184 lb) 83.7 kg (184 lb 8.4 oz) 81.1 kg (178 lb 12.7 oz)    Exam: General exam: In no acute distress. Respiratory system: Good air movement and clear to auscultation. Cardiovascular system: S1 & S2 heard, RRR.  Gastrointestinal system: Abdomen is nondistended, soft and nontender.  Central nervous system: Alert and oriented. No focal neurological deficits. Extremities: No pedal edema. Skin: No rashes, lesions or ulcers Psychiatry: Judgement and insight appear normal. Mood & affect appropriate.    Data Reviewed:    Labs: Basic Metabolic Panel: Recent Labs  Lab 11/15/17 2106 11/16/17 0510  NA 139 141  K 3.5 3.7  CL 100 105  CO2 25 26  GLUCOSE 83 79  BUN 6 6  CREATININE 1.04 0.98  CALCIUM 9.0 8.6*   GFR Estimated Creatinine Clearance: 103.4 mL/min (by C-G formula based on SCr of 0.98 mg/dL). Liver Function Tests: Recent Labs  Lab 11/15/17 2106 11/16/17 0510  AST 35 34  ALT 25 25  ALKPHOS 88 79  BILITOT 0.7 0.8  PROT 8.3* 6.9  ALBUMIN 4.0 3.3*   Recent Labs  Lab 11/15/17 2232  LIPASE 47   No results for input(s): AMMONIA in the last 168 hours. Coagulation profile Recent Labs  Lab 11/15/17 2232  INR 0.95    CBC: Recent Labs  Lab 11/15/17 2106 11/15/17 2232 11/16/17 0510  WBC 13.5*  --  10.0  NEUTROABS  --  10.2*  --   HGB 15.2  --  14.3  HCT 43.9  --  42.1  MCV 91.3  --  92.5  PLT 420*  --  352   Cardiac Enzymes: No results for input(s): CKTOTAL, CKMB, CKMBINDEX, TROPONINI in the last 168 hours. BNP (last 3 results) No results for input(s): PROBNP in the last 8760 hours. CBG: No results for input(s): GLUCAP in the last 168 hours. D-Dimer: No results for input(s): DDIMER in the last 72  hours. Hgb A1c: No results for input(s): HGBA1C in the last 72 hours. Lipid Profile: No results for input(s): CHOL, HDL, LDLCALC, TRIG, CHOLHDL, LDLDIRECT in the last 72 hours. Thyroid function studies: Recent Labs    11/16/17 0510  TSH 0.717   Anemia work up: No results for input(s): VITAMINB12, FOLATE, FERRITIN, TIBC, IRON, RETICCTPCT in the last 72 hours. Sepsis Labs: Recent Labs  Lab 11/15/17 2106 11/16/17 0510  WBC 13.5* 10.0   Microbiology Recent Results (from the past 240 hour(s))  MRSA PCR Screening     Status: None   Collection Time: 11/17/17  4:52 AM  Result Value Ref Range Status   MRSA by PCR NEGATIVE NEGATIVE Final    Comment:        The GeneXpert MRSA Assay (FDA approved for NASAL specimens only), is one component of a comprehensive MRSA colonization surveillance program. It is not intended to diagnose MRSA infection nor to guide or monitor treatment for MRSA infections. Performed at [facility], [address] [place], [place] [address]      Medications:   . chlordiazePOXIDE  25 mg Oral TID   Followed by  . [START ON 11/19/2017] chlordiazePOXIDE  25 mg Oral BH-qamhs   Followed by  . [START ON 11/20/2017] chlordiazePOXIDE  25 mg Oral Daily  . escitalopram  20 mg Oral Daily  . multivitamin with minerals  1 tablet Oral Daily  . pantoprazole  40 mg Oral BID  . QUEtiapine  25 mg Oral QHS  . sodium chloride flush  3 mL Intravenous Q12H  . thiamine  100 mg Oral Daily   Or  . thiamine  100 mg Intravenous Daily  . thiamine  100 mg Oral Daily   Continuous Infusions:     LOS: 2 days   Marinda ElkAbraham [name]  [facility] Pager [phone]  *Please refer to [email or web address], password [email or web address] to get updated schedule on who will round on this patient, as hospitalists switch teams weekly. If 7PM-7AM, please contact night-coverage at [email or web address], password [email or web address] for any overnight needs.  11/18/2017, 11:29 AM

## 2017-11-18 NOTE — Progress Notes (Addendum)
CSW completed paperwork and notified provider who requested it but provider stated he had left the hospital grounds and is not returning.  Provider requested CSW fill paperwork out again with new provider's name and stated he will contact new2nd shift  provider to alert him.  CSW paged new provider at ph: [phone].    CSW will fill paperwork out again with new provider's name and attempt to contact/meet new provider on [facility] [place] where ED secretary is a Insurance account manager.  3:83 PM CSW is waiting with completed IVC paperwork on [facility] with ED secretary who is the [employer] for the 2nd shift provider to arrive to sign paperwork so CSW can send to magistrate and request pt be served.  8:03 PM CSW paged provider again and CSW still at [facility] awaiting 2nd shift provider.  8:06 PM CSW met with provider who met with the pt. Per provider pt presents well with a plan for safety and has a friend arriving to p/u the pt. Director was consulted and agrees.  RN updated.  CSW will continue to follow for D/C needs.  Alphonse Guild. Mekiah Wahler, LCSW, LCAS, CSI Clinical Social Worker Ph: [phone]

## 2017-11-18 NOTE — Progress Notes (Signed)
Pt wants to leave AMA. Pt feels like we are not doing enough care for him and wants to go to [facility] because he knows a lot of the staff there.Pt is calm at the moment but still stressing about leaving.

## 2017-11-18 NOTE — Progress Notes (Signed)
Pt refusing IV meds... Agreed to Librium & atarax

## 2017-11-18 NOTE — Progress Notes (Signed)
PT REFUSED EKG AND VITALS SIGNS THIS MORNING  PLEASE TRY AGAIN AT LATER TIME  THANK YOU.

## 2017-11-18 NOTE — Consult Note (Signed)
[facility] Face-to-Face [facility]iatry Consult   Reason for Consult:  Suicidal  Referring Physician:  Dr. David StallFeliz Nichols  Patient Identification: Russell HallsMichael Nichols MRN:  [id number] Principal Diagnosis: Substance induced mood disorder [facility](HCC) Diagnosis:   Patient Active Problem List   Diagnosis Date Noted  . Alcohol dependence with uncomplicated withdrawal (HCC) [F10.230] 11/16/2017  . Tachycardia [R00.0] 11/16/2017  . Alcohol intoxication (HCC) [F10.929] 11/16/2017  . Leukocytosis [D72.829] 11/16/2017  . Thrombocytopenia (HCC) [D69.6] 11/16/2017    Total Time spent with patient: 1 hour  Subjective:   Russell Nichols is a 47 y.o. male patient admitted with alcohol withdrawal.  HPI:   Per chart review, patient was admitted with alcohol withdrawal. He has been very combative and agitated. He reported yesterday that the alarms from the telesitter monitor were making his anxiety and PTSD worse. He reported, "A man in my mental state can only take so much of this before I want to die. When asked about SI he reported, "If I wanted to kill myself I would have done it by now but yall are really pushing me." Home medications include Lexapro 20 mg daily and Seroquel 25 mg qhs. He received Haldol 2 mg around midnight for agitation. UDS was positive for THC and BAL was negative on admission.   On interview, Mr. Russell Nichols reports endorsing SI yesterday due to frustration about multiple disturbances in the hospital.  He denies SI or any intention to harm himself.  He does endorse depression with mood lability, poor sleep, poor appetite and a 25 pound weight loss over the past 1-2 months.  He reports having panic attacks and therefore isolates from others when his anxiety is high.  He self medicates with marijuana.  He also reports heavy alcohol use.  Current stressors include the loss of his job and homelessness.  He reports that he has been inconsistently taking Lexapro due to lack of access to a provider.  He occasionally  takes Seroquel for sleep that he gets from a friend.  He reports a history of bipolar disorder, schizophrenia and personality disorder in the setting of drug use.  He denies a history of manic symptoms (decreased need for sleep, increased energy, pressured speech or euphoria).  He denies access to guns or weapons.  He reports that he would like resources to see a psychiatrist and therapist because he "wants to get better."  Past Psychiatric History: PTSD, anxiety, bipolar disorder, schizophrenia, cannabis abuse and alcohol abuse.    Risk to Self:  None. Denies SI. Risk to Others:  None. Denies HI.  Prior Inpatient Therapy:  Multiple prior hospitalizations and last hospitalized at 47 years old after suicide attempt by jumping off a building. He reports a history of multiple suicide attempts and last attempted suicide greater than 5 years ago by jumping on a train track.  Prior Outpatient Therapy:  Prior medications include Geodon, Trileptal, Risperdal, Celexa and Lithium.   Past Medical History:  Past Medical History:  Diagnosis Date  . Alcoholism (HCC)    History reviewed. No pertinent surgical history. Family History:  Family History  Family history unknown: Yes   Family Psychiatric  History: Paternal aunt-mental illness and mother-mental illness.  Social History:  Social History   Substance and Sexual Activity  Alcohol Use Yes  . Alcohol/week: 50.4 oz  . Types: 84 Cans of beer per week     Social History   Substance and Sexual Activity  Drug Use Yes  . Types: Marijuana    Social History  Socioeconomic History  . Marital status: Single    Spouse name: Not on file  . Number of children: Not on file  . Years of education: Not on file  . Highest education level: Not on file  Occupational History  . Not on file  Social Needs  . Financial resource strain: Not on file  . Food insecurity:    Worry: Not on file    Inability: Not on file  . Transportation needs:    Medical:  Not on file    Non-medical: Not on file  Tobacco Use  . Smoking status: Former Games developer  . Smokeless tobacco: Current User  Substance and Sexual Activity  . Alcohol use: Yes    Alcohol/week: 50.4 oz    Types: 84 Cans of beer per week  . Drug use: Yes    Types: Marijuana  . Sexual activity: Not on file  Lifestyle  . Physical activity:    Days per week: Not on file    Minutes per session: Not on file  . Stress: Not on file  Relationships  . Social connections:    Talks on phone: Not on file    Gets together: Not on file    Attends religious service: Not on file    Active member of club or organization: Not on file    Attends meetings of clubs or organizations: Not on file    Relationship status: Not on file  Other Topics Concern  . Not on file  Social History Narrative  . Not on file   Additional Social History: He is homeless. He is unemployed. He previously worked for [employer] and was a Secondary school teacher several years ago. He reports a history of heavy alcohol use. He drinks up to 16 beers daily. He has a history of DTs and seizures. He reports heavy marijuana use. He was sober for 5 years (2014-2019). He has been to rehab multiple times.     Allergies:   Allergies  Allergen Reactions  . Penicillins     Has patient had a PCN reaction causing immediate rash, facial/tongue/throat swelling, SOB or lightheadedness with hypotension: Y Has patient had a PCN reaction causing severe rash involving mucus membranes or skin necrosis: Y Has patient had a PCN reaction that required hospitalization: N Has patient had a PCN reaction occurring within the last 10 years: N If all of the above answers are "NO", then may proceed with Cephalosporin use.     Labs:  Results for orders placed or performed during the hospital encounter of 11/15/17 (from the past 48 hour(s))  MRSA PCR Screening     Status: None   Collection Time: 11/17/17  4:52 AM  Result Value Ref Range   MRSA by PCR  NEGATIVE NEGATIVE    Comment:        The GeneXpert MRSA Assay (FDA approved for NASAL specimens only), is one component of a comprehensive MRSA colonization surveillance program. It is not intended to diagnose MRSA infection nor to guide or monitor treatment for MRSA infections. Performed at [facility], [address], [place], [place] [address]   Urinalysis, Routine w reflex microscopic     Status: Abnormal   Collection Time: 11/17/17 10:43 PM  Result Value Ref Range   Color, Urine STRAW (A) YELLOW   APPearance CLEAR CLEAR   Specific Gravity, Urine 1.005 1.005 - 1.030   pH 7.0 5.0 - 8.0   Glucose, UA NEGATIVE NEGATIVE mg/dL   Hgb urine  dipstick NEGATIVE NEGATIVE   Bilirubin Urine NEGATIVE NEGATIVE   Ketones, ur NEGATIVE NEGATIVE mg/dL   Protein, ur NEGATIVE NEGATIVE mg/dL   Nitrite NEGATIVE NEGATIVE   Leukocytes, UA NEGATIVE NEGATIVE    Comment: Performed at [facility], [address] [place], [place] [address]    Current Facility-Administered Medications  Medication Dose Route Frequency Provider Last Rate Last Dose  . acetaminophen (TYLENOL) tablet 650 mg  650 mg Oral Q6H PRN Marinda Elk, MD   650 mg at 11/16/17 2115   Or  . acetaminophen (TYLENOL) suppository 650 mg  650 mg Rectal Q6H PRN Marinda Elk, MD      . chlordiazePOXIDE (LIBRIUM) capsule 25 mg  25 mg Oral Q6H PRN Marinda Elk, MD      . chlordiazePOXIDE (LIBRIUM) capsule 25 mg  25 mg Oral TID Marinda Elk, MD       Followed by  . [START ON 11/19/2017] chlordiazePOXIDE (LIBRIUM) capsule 25 mg  25 mg Oral BH-qamhs Marinda Elk, MD       Followed by  . [START ON 11/20/2017] chlordiazePOXIDE (LIBRIUM) capsule 25 mg  25 mg Oral Daily Marinda Elk, MD      . escitalopram [facility]) tablet 20 mg  20 mg Oral Daily Marinda Elk, MD   20 mg at 11/17/17 1018  . haloperidol lactate (HALDOL) injection 2 mg  2 mg Intravenous Q6H  PRN Marinda Elk, MD   2 mg at 11/18/17 0021  . hydrOXYzine (ATARAX/VISTARIL) tablet 25 mg  25 mg Oral Q6H PRN Marinda Elk, MD      . loperamide (IMODIUM) capsule 2-4 mg  2-4 mg Oral PRN Marinda Elk, MD      . LORazepam (ATIVAN) injection 2 mg  2 mg Intravenous Q6H PRN Marinda Elk, MD   2 mg at 11/17/17 2144  . multivitamin with minerals tablet 1 tablet  1 tablet Oral Daily Marinda Elk, MD   1 tablet at 11/17/17 1018  . ondansetron (ZOFRAN-ODT) disintegrating tablet 4 mg  4 mg Oral Q6H PRN Marinda Elk, MD      . pantoprazole (PROTONIX) EC tablet 40 mg  40 mg Oral BID Marinda Elk, MD   40 mg at 11/17/17 2144  . QUEtiapine (SEROQUEL) tablet 25 mg  25 mg Oral QHS Marinda Elk, MD   25 mg at 11/17/17 2144  . sodium chloride flush (NS) 0.9 % injection 3 mL  3 mL Intravenous Q12H Marinda Elk, MD   3 mL at 11/17/17 2146  . sodium chloride flush (NS) 0.9 % injection 3 mL  3 mL Intravenous PRN Marinda Elk, MD      . thiamine (VITAMIN B-1) tablet 100 mg  100 mg Oral Daily Marinda Elk, MD   100 mg at 11/17/17 1018   Or  . thiamine (B-1) injection 100 mg  100 mg Intravenous Daily Marinda Elk, MD   100 mg at 11/16/17 0954  . thiamine (VITAMIN B-1) tablet 100 mg  100 mg Oral Daily Marinda Elk, MD        Musculoskeletal: Strength & Muscle Tone: within normal limits Gait & Station: UTA since patient is lying in bed. Patient leans: N/A  Psychiatric Specialty Exam: Physical Exam  Nursing note and vitals reviewed. Constitutional: He is oriented to person, place, and time. He appears well-developed and well-nourished.  HENT:  Head: Normocephalic and atraumatic.  Neck: Normal range  of motion.  Respiratory: Effort normal.  Musculoskeletal: Normal range of motion.  Neurological: He is alert and oriented to person, place, and time.  Skin: No rash noted.  Psychiatric: His speech is normal and  behavior is normal. Judgment and thought content normal. Cognition and memory are normal. He exhibits a depressed mood.    Review of Systems  Constitutional: Negative for chills, fever and weight loss.  Cardiovascular: Negative for chest pain.  Gastrointestinal: Positive for abdominal pain. Negative for constipation, diarrhea, nausea and vomiting.  Psychiatric/Behavioral: Positive for depression and substance abuse. Negative for hallucinations and suicidal ideas. The patient is nervous/anxious and has insomnia.   All other systems reviewed and are negative.   Blood pressure (!) 145/96, pulse 79, temperature 98 F (36.7 C), temperature source Oral, resp. rate 17, height 6' (1.829 m), weight 81.1 kg (178 lb 12.7 oz), SpO2 100 %.Body mass index is 24.25 kg/m.  General Appearance: Disheveled, Caucasian male, wearing a hospital gown with unshaved face and sitting up in bed. NAD.   Eye Contact:  Good  Speech:  Clear and Coherent, Slow and Slurred  Volume:  Normal  Mood:  Depressed  Affect:  Constricted  Thought Process:  Goal Directed, Linear and Descriptions of Associations: Intact  Orientation:  Full (Time, Place, and Person)  Thought Content:  Logical  Suicidal Thoughts:  No  Homicidal Thoughts:  No  Memory:  Immediate;   Good Recent;   Good Remote;   Good  Judgement:  Fair  Insight:  Fair  Psychomotor Activity:  Normal  Concentration:  Concentration: Good and Attention Span: Good  Recall:  Good  Fund of Knowledge:  Good  Language:  Good  Akathisia:  No  Handed:  Right  AIMS (if indicated):   N/A  Assets:  Communication Skills Desire for Improvement  ADL's:  Intact  Cognition:  WNL  Sleep:   Poor   Assessment:  Grayling Schranz is a 47 y.o. male who was admitted with alcohol withdrawal. He endorsed SI in the setting of frustration although he denies current SI or any intention to harm self. He is future oriented and help seeking. He denies HI or AVH. He does not warrant  inpatient psychiatric hospitalization at this time. Recommend Remeron for depression and anxiety and SW provide patient with outpatient resources for local psychiatrists and therapists.   Treatment Plan Summary: -Start Remeron 15 mg qhs for depression, anxiety, insomnia and poor appetite.  -Please have unit SW provide patient with resources for local psychiatrists and therapists.  -Psychiatry will sign off on patient at this time. Please consult psychiatry again as needed.   Disposition: No evidence of imminent risk to self or others at present.   Patient does not meet criteria for psychiatric inpatient admission.  Cherly Beach, DO 11/18/2017 11:46 AM

## 2017-11-18 NOTE — Progress Notes (Signed)
Pt asked to leave AMA. With MD Toniann FailKakrakandy present pt stated "I am not suicidal, nor do I have plans to harm myself." MD then told the pt to come back if he had any thoughts of harming himself. RN removed iv, pt signed AMA papers and left the premises.  11/18/2017 Molinda BailiffAnesha  Janele Lague, RN

## 2017-11-18 NOTE — Plan of Care (Signed)
I was told to come and involuntarily commit the patient for patient having suicidal thoughts and trying to leave AGAINST MEDICAL ADVICE.  On exam at bedside patient states he is not suicidal and not depressed at this time his family friend who is known to him long time is at the bedside.Had long discussion with the patient in the room along with patient's nurse and myself alone. Patient states he wants to go to [facility] and go to follow-up with his physician for further care.  He currently denies any suicidal thoughts or any homicidal thoughts and his family friend at his bedside is willing to take care of him.  Patient knows his plans and knows what to do.  He also told the nurse that he has no suicidal thoughts and has no plans of harming himself or others.  Discussed with my Interior and spatial designerdirector.  Reviewed patient's charts. Patient knows what he is doing has insight and is clearly stating not suicidal and has plans set forth for follow-up and denies any harm for himself or others and has a caretaker taking him home patient is discharged AGAINST MEDICAL ADVICE.  Russell Nichols.

## 2017-11-19 DIAGNOSIS — F1994 Other psychoactive substance use, unspecified with psychoactive substance-induced mood disorder: Secondary | ICD-10-CM

## 2017-11-20 ENCOUNTER — Emergency Department (HOSPITAL_COMMUNITY)
Admission: EM | Admit: 2017-11-20 | Discharge: 2017-11-20 | Disposition: A | Discharge status: Other Acute Inpatient Hospital | Payer: PRIVATE HEALTH INSURANCE | Attending: Emergency Medicine | Admitting: Emergency Medicine

## 2017-11-20 ENCOUNTER — Inpatient Hospital Stay (HOSPITAL_COMMUNITY)
Admission: AD | Admit: 2017-11-20 | Discharge: 2017-11-26 | DRG: 885 | Disposition: A | Discharge status: Home / Self Care | Payer: Federal, State, Local not specified - Other | Source: Intra-hospital | Attending: Psychiatry | Admitting: Psychiatry

## 2017-11-20 ENCOUNTER — Encounter (HOSPITAL_COMMUNITY): Payer: Self-pay

## 2017-11-20 ENCOUNTER — Other Ambulatory Visit: Payer: Self-pay

## 2017-11-20 ENCOUNTER — Encounter (HOSPITAL_COMMUNITY): Payer: Self-pay | Admitting: Emergency Medicine

## 2017-11-20 DIAGNOSIS — Z62810 Personal history of physical and sexual abuse in childhood: Secondary | ICD-10-CM | POA: Diagnosis present

## 2017-11-20 DIAGNOSIS — Z79899 Other long term (current) drug therapy: Secondary | ICD-10-CM

## 2017-11-20 DIAGNOSIS — Y92239 Unspecified place in hospital as the place of occurrence of the external cause: Secondary | ICD-10-CM

## 2017-11-20 DIAGNOSIS — F1994 Other psychoactive substance use, unspecified with psychoactive substance-induced mood disorder: Secondary | ICD-10-CM | POA: Insufficient documentation

## 2017-11-20 DIAGNOSIS — F329 Major depressive disorder, single episode, unspecified: Secondary | ICD-10-CM | POA: Insufficient documentation

## 2017-11-20 DIAGNOSIS — G4700 Insomnia, unspecified: Secondary | ICD-10-CM | POA: Diagnosis present

## 2017-11-20 DIAGNOSIS — F333 Major depressive disorder, recurrent, severe with psychotic symptoms: Secondary | ICD-10-CM | POA: Diagnosis present

## 2017-11-20 DIAGNOSIS — R45851 Suicidal ideations: Secondary | ICD-10-CM | POA: Diagnosis present

## 2017-11-20 DIAGNOSIS — F419 Anxiety disorder, unspecified: Secondary | ICD-10-CM | POA: Insufficient documentation

## 2017-11-20 DIAGNOSIS — Z880 Allergy status to penicillin: Secondary | ICD-10-CM | POA: Diagnosis not present

## 2017-11-20 DIAGNOSIS — Y904 Blood alcohol level of 80-99 mg/100 ml: Secondary | ICD-10-CM | POA: Insufficient documentation

## 2017-11-20 DIAGNOSIS — F10231 Alcohol dependence with withdrawal delirium: Secondary | ICD-10-CM | POA: Diagnosis present

## 2017-11-20 DIAGNOSIS — S0990XA Unspecified injury of head, initial encounter: Secondary | ICD-10-CM | POA: Diagnosis present

## 2017-11-20 DIAGNOSIS — F4310 Post-traumatic stress disorder, unspecified: Secondary | ICD-10-CM | POA: Diagnosis present

## 2017-11-20 DIAGNOSIS — R110 Nausea: Secondary | ICD-10-CM | POA: Insufficient documentation

## 2017-11-20 DIAGNOSIS — F1020 Alcohol dependence, uncomplicated: Secondary | ICD-10-CM | POA: Insufficient documentation

## 2017-11-20 DIAGNOSIS — F10221 Alcohol dependence with intoxication delirium: Secondary | ICD-10-CM

## 2017-11-20 DIAGNOSIS — R1013 Epigastric pain: Secondary | ICD-10-CM | POA: Insufficient documentation

## 2017-11-20 DIAGNOSIS — Z046 Encounter for general psychiatric examination, requested by authority: Secondary | ICD-10-CM | POA: Insufficient documentation

## 2017-11-20 DIAGNOSIS — F17290 Nicotine dependence, other tobacco product, uncomplicated: Secondary | ICD-10-CM | POA: Diagnosis present

## 2017-11-20 DIAGNOSIS — W19XXXA Unspecified fall, initial encounter: Secondary | ICD-10-CM

## 2017-11-20 DIAGNOSIS — W1830XA Fall on same level, unspecified, initial encounter: Secondary | ICD-10-CM | POA: Diagnosis present

## 2017-11-20 DIAGNOSIS — F17220 Nicotine dependence, chewing tobacco, uncomplicated: Secondary | ICD-10-CM | POA: Insufficient documentation

## 2017-11-20 LAB — CBC WITH DIFFERENTIAL/PLATELET
BASOS ABS: 0 10*3/uL (ref 0.0–0.1)
BASOS PCT: 0 %
EOS PCT: 2 %
Eosinophils Absolute: 0.2 10*3/uL (ref 0.0–0.7)
HCT: 43.9 % (ref 39.0–52.0)
Hemoglobin: 15.5 g/dL (ref 13.0–17.0)
Lymphocytes Relative: 16 %
Lymphs Abs: 1.7 10*3/uL (ref 0.7–4.0)
MCH: 32.2 pg (ref 26.0–34.0)
MCHC: 35.3 g/dL (ref 30.0–36.0)
MCV: 91.1 fL (ref 78.0–100.0)
MONO ABS: 0.8 10*3/uL (ref 0.1–1.0)
Monocytes Relative: 8 %
NEUTROS ABS: 8.2 10*3/uL — AB (ref 1.7–7.7)
Neutrophils Relative %: 74 %
PLATELETS: 315 10*3/uL (ref 150–400)
RBC: 4.82 MIL/uL (ref 4.22–5.81)
RDW: 12.8 % (ref 11.5–15.5)
WBC: 11 10*3/uL — ABNORMAL HIGH (ref 4.0–10.5)

## 2017-11-20 LAB — COMPREHENSIVE METABOLIC PANEL
ALBUMIN: 3.8 g/dL (ref 3.5–5.0)
ALT: 26 U/L (ref 0–44)
ANION GAP: 14 (ref 5–15)
AST: 35 U/L (ref 15–41)
Alkaline Phosphatase: 90 U/L (ref 38–126)
BUN: 5 mg/dL — ABNORMAL LOW (ref 6–20)
CALCIUM: 9.3 mg/dL (ref 8.9–10.3)
CO2: 22 mmol/L (ref 22–32)
CREATININE: 1.08 mg/dL (ref 0.61–1.24)
Chloride: 102 mmol/L (ref 98–111)
GFR calc Af Amer: >60 mL/min (ref >60)
GFR calc non Af Amer: >60 mL/min (ref >60)
Glucose, Bld: 158 mg/dL — ABNORMAL HIGH (ref 70–99)
POTASSIUM: 3.8 mmol/L (ref 3.5–5.1)
Sodium: 138 mmol/L (ref 135–145)
TOTAL PROTEIN: 7.6 g/dL (ref 6.5–8.1)
Total Bilirubin: 0.4 mg/dL (ref 0.3–1.2)

## 2017-11-20 LAB — RAPID URINE DRUG SCREEN, HOSP PERFORMED
AMPHETAMINES: NOT DETECTED
Benzodiazepines: POSITIVE — AB
Cocaine: POSITIVE — AB
Opiates: NOT DETECTED
Tetrahydrocannabinol: POSITIVE — AB

## 2017-11-20 LAB — ETHANOL: ALCOHOL ETHYL (B): 86 mg/dL — AB (ref <10)

## 2017-11-20 LAB — LIPASE, BLOOD: LIPASE: 35 U/L (ref 11–51)

## 2017-11-20 MED ORDER — OLANZAPINE 5 MG PO TBDP
5.0000 mg | ORAL_TABLET | Freq: Once | ORAL | Status: DC
  Filled 2017-11-20: qty 1

## 2017-11-20 MED ORDER — CLONIDINE HCL 0.1 MG PO TABS
0.1000 mg | ORAL_TABLET | Freq: Once | ORAL | Status: AC
  Administered 2017-11-20: 0.1 mg via ORAL
  Filled 2017-11-20: qty 1

## 2017-11-20 MED ORDER — VITAMIN B-1 100 MG PO TABS
100.0000 mg | ORAL_TABLET | Freq: Every day | ORAL | Status: DC
  Administered 2017-11-21 – 2017-11-26 (×6): 100 mg via ORAL
  Filled 2017-11-20 (×9): qty 1

## 2017-11-20 MED ORDER — LORAZEPAM 2 MG/ML IJ SOLN: 0.0000 mg | Freq: Two times a day (BID) | INTRAMUSCULAR | Status: DC

## 2017-11-20 MED ORDER — BENZTROPINE MESYLATE 1 MG PO TABS: 1.0000 mg | ORAL_TABLET | Freq: Four times a day (QID) | ORAL | Status: DC | PRN

## 2017-11-20 MED ORDER — HALOPERIDOL 5 MG PO TABS
5.0000 mg | ORAL_TABLET | Freq: Four times a day (QID) | ORAL | Status: DC | PRN
  Filled 2017-11-20: qty 1

## 2017-11-20 MED ORDER — THIAMINE HCL 100 MG/ML IJ SOLN: 100.0000 mg | Freq: Every day | INTRAMUSCULAR | Status: DC

## 2017-11-20 MED ORDER — ALUM & MAG HYDROXIDE-SIMETH 200-200-20 MG/5ML PO SUSP: 30.0000 mL | ORAL | Status: DC | PRN

## 2017-11-20 MED ORDER — ONDANSETRON HCL 4 MG/2ML IJ SOLN
4.0000 mg | Freq: Once | INTRAMUSCULAR | Status: AC
  Administered 2017-11-20: 4 mg via INTRAVENOUS
  Filled 2017-11-20: qty 2

## 2017-11-20 MED ORDER — LORAZEPAM 1 MG PO TABS: 0.0000 mg | ORAL_TABLET | Freq: Two times a day (BID) | ORAL | Status: DC

## 2017-11-20 MED ORDER — DIPHENHYDRAMINE HCL 50 MG/ML IJ SOLN
50.0000 mg | Freq: Once | INTRAMUSCULAR | Status: AC
  Administered 2017-11-20: 50 mg via INTRAMUSCULAR
  Filled 2017-11-20: qty 1

## 2017-11-20 MED ORDER — MAGNESIUM HYDROXIDE 400 MG/5ML PO SUSP: 30.0000 mL | Freq: Every day | ORAL | Status: DC | PRN

## 2017-11-20 MED ORDER — LORAZEPAM 1 MG PO TABS: 0.0000 mg | ORAL_TABLET | Freq: Four times a day (QID) | ORAL | Status: DC

## 2017-11-20 MED ORDER — ACETAMINOPHEN 325 MG PO TABS
650.0000 mg | ORAL_TABLET | Freq: Four times a day (QID) | ORAL | Status: DC | PRN
  Administered 2017-11-25: 650 mg via ORAL
  Filled 2017-11-20: qty 2

## 2017-11-20 MED ORDER — LORAZEPAM 2 MG/ML IJ SOLN
INTRAMUSCULAR | Status: AC
  Filled 2017-11-20: qty 1

## 2017-11-20 MED ORDER — HALOPERIDOL LACTATE 5 MG/ML IJ SOLN
INTRAMUSCULAR | Status: AC
  Filled 2017-11-20: qty 1

## 2017-11-20 MED ORDER — LORAZEPAM 2 MG/ML IJ SOLN: 0.0000 mg | Freq: Four times a day (QID) | INTRAMUSCULAR | Status: DC

## 2017-11-20 MED ORDER — LORAZEPAM 2 MG/ML IJ SOLN
1.0000 mg | Freq: Once | INTRAMUSCULAR | Status: AC
  Administered 2017-11-20: 1 mg via INTRAVENOUS
  Filled 2017-11-20: qty 1

## 2017-11-20 MED ORDER — LORAZEPAM 2 MG/ML IJ SOLN
0.0000 mg | Freq: Four times a day (QID) | INTRAMUSCULAR | Status: DC
  Administered 2017-11-20: 2 mg via INTRAVENOUS
  Filled 2017-11-20: qty 1

## 2017-11-20 MED ORDER — VITAMIN B-1 100 MG PO TABS
100.0000 mg | ORAL_TABLET | Freq: Every day | ORAL | Status: DC
  Administered 2017-11-20: 100 mg via ORAL
  Filled 2017-11-20: qty 1

## 2017-11-20 MED ORDER — HYDROXYZINE HCL 25 MG PO TABS
25.0000 mg | ORAL_TABLET | Freq: Three times a day (TID) | ORAL | Status: DC | PRN
  Administered 2017-11-21 – 2017-11-25 (×5): 25 mg via ORAL
  Filled 2017-11-20: qty 1
  Filled 2017-11-20: qty 10
  Filled 2017-11-20 (×4): qty 1

## 2017-11-20 MED ORDER — SODIUM CHLORIDE 0.9 % IV BOLUS
1000.0000 mL | Freq: Once | INTRAVENOUS | Status: AC
  Administered 2017-11-20: 1000 mL via INTRAVENOUS

## 2017-11-20 MED ORDER — HALOPERIDOL LACTATE 5 MG/ML IJ SOLN
5.0000 mg | Freq: Once | INTRAMUSCULAR | Status: AC
  Administered 2017-11-20: 5 mg via INTRAMUSCULAR
  Filled 2017-11-20: qty 1

## 2017-11-20 MED ORDER — LORAZEPAM 1 MG PO TABS
0.0000 mg | ORAL_TABLET | Freq: Four times a day (QID) | ORAL | Status: DC
  Administered 2017-11-21: 2 mg via ORAL
  Filled 2017-11-20: qty 2

## 2017-11-20 MED ORDER — LORAZEPAM 2 MG/ML IJ SOLN
2.0000 mg | Freq: Once | INTRAMUSCULAR | Status: AC
  Administered 2017-11-20: 2 mg via INTRAMUSCULAR

## 2017-11-20 MED ORDER — DIPHENHYDRAMINE HCL 50 MG/ML IJ SOLN
INTRAMUSCULAR | Status: AC
  Filled 2017-11-20: qty 1

## 2017-11-20 MED ORDER — TRAZODONE HCL 50 MG PO TABS
50.0000 mg | ORAL_TABLET | Freq: Every evening | ORAL | Status: DC | PRN
  Administered 2017-11-21 – 2017-11-25 (×5): 50 mg via ORAL
  Filled 2017-11-20: qty 7
  Filled 2017-11-20 (×5): qty 1
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
## 2017-11-20 NOTE — ED Notes (Signed)
Pt reports nausea. MD made aware. See orders.

## 2017-11-20 NOTE — ED Notes (Signed)
Pt's IV is d/c at this time. 

## 2017-11-20 NOTE — Discharge Summary (Signed)
Physician Discharge Summary  Russell Nichols ZOX:[id number] DOB: 10-18-1970 DOA: 11/15/2017  PCP: Patient, No Pcp Per  Admit date: 11/15/2017 Discharge date: 11/20/2017  Admitted From: Home Disposition: Home  Recommendations for Outpatient Follow-up:      LEFT AMA  Home Health:No Equipment/Devices:none  Discharge Condition:Stable CODE STATUS:full Diet recommendation: Heart Healthy  Brief/Interim Summary: 47 y.o. male past medical history of alcoholism comes in for epigastric discomfort and desire detox.    Discharge Diagnoses:  Principal Problem:   Substance induced mood disorder (HCC) Active Problems:   Alcohol dependence with uncomplicated withdrawal (HCC)   Tachycardia   Alcohol intoxication (HCC)   Leukocytosis   Thrombocytopenia (HCC)  Alcohol dependence with uncomplicated withdrawals: Patient was started on the Librium protocol, and his symptoms improved. As the patient was making multiple threats but not making the right full expression about taking his life or having planned psychiatry was consulted they deemed him not an inpatient psychiatric candidate.  Leukocytosis and thrombocytopenia: Likely reactive to if he remained afebrile resolved.  Sinus tach: Likely due to withdrawal now resolved.  Hallucinations/anxiety and depression: Altered who recommended to start Ranexa for anxiety and depression.   Discharge Instructions   Allergies as of 11/18/2017      Reactions   Penicillins    Has patient had a PCN reaction causing immediate rash, facial/tongue/throat swelling, SOB or lightheadedness with hypotension: Y Has patient had a PCN reaction causing severe rash involving mucus membranes or skin necrosis: Y Has patient had a PCN reaction that required hospitalization: N Has patient had a PCN reaction occurring within the last 10 years: N If all of the above answers are "NO", then may proceed with Cephalosporin use.      Medication List    ASK your doctor  about these medications   acetaminophen 650 MG CR tablet Commonly known as:  TYLENOL Take 650 mg by mouth every 8 (eight) hours as needed for pain.   escitalopram 20 MG tablet Commonly known as:  LEXAPRO Take 20 mg by mouth daily.   QUEtiapine 50 MG tablet Commonly known as:  SEROQUEL Take 25 mg by mouth at bedtime.   sulfamethoxazole-trimethoprim 800-160 MG tablet Commonly known as:  SEPTRA DS Take 1 tablet by mouth 2 (two) times daily.       Allergies  Allergen Reactions  . Penicillins     Has patient had a PCN reaction causing immediate rash, facial/tongue/throat swelling, SOB or lightheadedness with hypotension: Y Has patient had a PCN reaction causing severe rash involving mucus membranes or skin necrosis: Y Has patient had a PCN reaction that required hospitalization: N Has patient had a PCN reaction occurring within the last 10 years: N If all of the above answers are "NO", then may proceed with Cephalosporin use.     Consultations:  Psyq   Procedures/Studies:  No results found.   Subjective: No complains  Discharge Exam: Vitals:   11/17/17 2057 11/18/17 1449  BP:  (!) 124/102  Pulse:  75  Resp: 17 20  Temp: 98 F (36.7 C) 98.3 F (36.8 C)  SpO2: 100% 98%   Vitals:   11/17/17 1206 11/17/17 1320 11/17/17 2057 11/18/17 1449  BP: (!) 133/95 118/68 (!) 145/96 (!) 124/102  Pulse: 74 76 79 75  Resp: 14 14 17 20   Temp: 97.7 F (36.5 C) 97.7 F (36.5 C) 98 F (36.7 C) 98.3 F (36.8 C)  TempSrc: Oral Oral Oral Oral  SpO2: 98% 94% 100% 98%  Weight:  Height:        General: Pt is alert, awake, not in acute distress Cardiovascular: RRR, S1/S2 +, no rubs, no gallops Respiratory: CTA bilaterally, no wheezing, no rhonchi Abdominal: Soft, NT, ND, bowel sounds + Extremities: no edema, no cyanosis    The results of significant diagnostics from this hospitalization (including imaging, microbiology, ancillary and laboratory) are listed below for  reference.     Microbiology: Recent Results (from the past 240 hour(s))  MRSA PCR Screening     Status: None   Collection Time: 11/17/17  4:52 AM  Result Value Ref Range Status   MRSA by PCR NEGATIVE NEGATIVE Final    Comment:        The GeneXpert MRSA Assay (FDA approved for NASAL specimens only), is one component of a comprehensive MRSA colonization surveillance program. It is not intended to diagnose MRSA infection nor to guide or monitor treatment for MRSA infections. Performed at [facility], [address] [place], [place] [address]      Labs: BNP (last 3 results) No results for input(s): BNP in the last 8760 hours. Basic Metabolic Panel: Recent Labs  Lab 11/15/17 2106 11/16/17 0510  NA 139 141  K 3.5 3.7  CL 100 105  CO2 25 26  GLUCOSE 83 79  BUN 6 6  CREATININE 1.04 0.98  CALCIUM 9.0 8.6*   Liver Function Tests: Recent Labs  Lab 11/15/17 2106 11/16/17 0510  AST 35 34  ALT 25 25  ALKPHOS 88 79  BILITOT 0.7 0.8  PROT 8.3* 6.9  ALBUMIN 4.0 3.3*   Recent Labs  Lab 11/15/17 2232  LIPASE 47   No results for input(s): AMMONIA in the last 168 hours. CBC: Recent Labs  Lab 11/15/17 2106 11/15/17 2232 11/16/17 0510  WBC 13.5*  --  10.0  NEUTROABS  --  10.2*  --   HGB 15.2  --  14.3  HCT 43.9  --  42.1  MCV 91.3  --  92.5  PLT 420*  --  352   Cardiac Enzymes: No results for input(s): CKTOTAL, CKMB, CKMBINDEX, TROPONINI in the last 168 hours. BNP: Invalid input(s): POCBNP CBG: No results for input(s): GLUCAP in the last 168 hours. D-Dimer No results for input(s): DDIMER in the last 72 hours. Hgb A1c No results for input(s): HGBA1C in the last 72 hours. Lipid Profile No results for input(s): CHOL, HDL, LDLCALC, TRIG, CHOLHDL, LDLDIRECT in the last 72 hours. Thyroid function studies No results for input(s): TSH, T4TOTAL, T3FREE, THYROIDAB in the last 72 hours.  Invalid input(s): FREET3 Anemia work up No results for  input(s): VITAMINB12, FOLATE, FERRITIN, TIBC, IRON, RETICCTPCT in the last 72 hours. Urinalysis    Component Value Date/Time   COLORURINE STRAW (A) 11/17/2017 2243   APPEARANCEUR CLEAR 11/17/2017 2243   LABSPEC 1.005 11/17/2017 2243   PHURINE 7.0 11/17/2017 2243   GLUCOSEU NEGATIVE 11/17/2017 2243   HGBUR NEGATIVE 11/17/2017 2243   BILIRUBINUR NEGATIVE 11/17/2017 2243   KETONESUR NEGATIVE 11/17/2017 2243   PROTEINUR NEGATIVE 11/17/2017 2243   UROBILINOGEN 0.2 11/20/2010 0449   NITRITE NEGATIVE 11/17/2017 2243   LEUKOCYTESUR NEGATIVE 11/17/2017 2243   Sepsis Labs Invalid input(s): PROCALCITONIN,  WBC,  LACTICIDVEN Microbiology Recent Results (from the past 240 hour(s))  MRSA PCR Screening     Status: None   Collection Time: 11/17/17  4:52 AM  Result Value Ref Range Status   MRSA by PCR NEGATIVE NEGATIVE Final    Comment:  The GeneXpert MRSA Assay (FDA approved for NASAL specimens only), is one component of a comprehensive MRSA colonization surveillance program. It is not intended to diagnose MRSA infection nor to guide or monitor treatment for MRSA infections. Performed at [facility], [address] [place], [place] [address]      Time coordinating discharge: 35 minutes  SIGNED:   Marinda ElkAbraham [name], MD  [facility] 11/20/2017, 6:02 AM Pager   If 7PM-7AM, please contact night-coverage [email or web address] Password [email or web address]

## 2017-11-20 NOTE — BH Assessment (Signed)
[facility] Assessment Progress Note  Per Lorenso QuarryHiren Umrania, MD, this pt requires psychiatric hospitalization at this time.  Malva LimesLinsey Strader, RN, [facility]  Pt has signed Voluntary Admission and Consent for Treatment, as well as Consent to Release Information to pt's father and pt's attorney, and signed [facility].  Pt's nurse has been notified, and agrees to send original paperwork along with pt via [place], and to call report to [phone].  Doylene Canninghomas Shawneequa Baldridge, [place] Behavioral Health Coordinator [phone]

## 2017-11-20 NOTE — ED Triage Notes (Signed)
Pt presents requesting detox "or I am going to die from seizures; I drank a drink an hour ago to get here." Pt severe tremor with triage.

## 2017-11-20 NOTE — ED Provider Notes (Signed)
[facility]-EMERGENCY DEPT Provider Note   CSN: [id number] Arrival date & time: 11/20/17  6063     History   Chief Complaint Chief Complaint  Patient presents with  . Delirium Tremens (DTS)  . Alcohol Problem  . Depression    HPI Russell Nichols is a 47 y.o. male.  HPI Patiently was recently admitted to the hospital for alcohol withdrawal.  He left AMA 2 days ago.  States that since that time he has been drinking 12-16 beers daily.  Last drink a 20 ounce beer shortly before arriving to the emergency department.  Is concerned he is going through withdrawals.  He endorses dysphoric mood, anxiety and tremulousness.  He has had sensory and visual hallucinations in the past but denies any currently.  States he has had decreased p.o. intake because of upper abdominal pain, nausea and vomiting.  Currently denies any abdominal pain.  He has had vague suicidality in the past but denies current suicidal ideation.  Admits to using marijuana in addition to alcohol. Past Medical History:  Diagnosis Date  . Alcoholism [facility])     Patient Active Problem List   Diagnosis Date Noted  . Substance induced mood disorder (HCC)   . Alcohol dependence with uncomplicated withdrawal (HCC) 11/16/2017  . Tachycardia 11/16/2017  . Alcohol intoxication (HCC) 11/16/2017  . Leukocytosis 11/16/2017  . Thrombocytopenia (HCC) 11/16/2017    History reviewed. No pertinent surgical history.      Home Medications    Prior to Admission medications   Medication Sig Start Date End Date Taking? Authorizing Provider  acetaminophen (TYLENOL) 650 MG CR tablet Take 650 mg by mouth every 8 (eight) hours as needed for pain.   Yes [provider]  cetirizine (ZYRTEC) 10 MG tablet Take 10 mg by mouth daily.   Yes [provider]  escitalopram (LEXAPRO) 20 MG tablet Take 20 mg by mouth daily. 10/16/17  Yes [provider]  QUEtiapine (SEROQUEL) 50 MG tablet Take 25 mg by  mouth daily.   Yes [provider]    Family History Family History  Family history unknown: Yes    Social History Social History   Tobacco Use  . Smoking status: Former Games developer  . Smokeless tobacco: Current User  Substance Use Topics  . Alcohol use: Yes    Alcohol/week: 50.4 oz    Types: 84 Cans of beer per week  . Drug use: Yes    Types: Marijuana     Allergies   Penicillins and Amoxicillin   Review of Systems Review of Systems  Constitutional: Negative for chills and fever.  Eyes: Negative for visual disturbance.  Respiratory: Negative for cough and shortness of breath.   Cardiovascular: Negative for chest pain, palpitations and leg swelling.  Gastrointestinal: Positive for abdominal pain, nausea and vomiting. Negative for constipation and diarrhea.  Genitourinary: Negative for dysuria, flank pain and frequency.  Musculoskeletal: Negative for back pain, myalgias, neck pain and neck stiffness.  Skin: Negative for rash and wound.  Neurological: Negative for dizziness, weakness, light-headedness, numbness and headaches.  Psychiatric/Behavioral: Positive for dysphoric mood. Negative for suicidal ideas. The patient is nervous/anxious.   All other systems reviewed and are negative.    Physical Exam Updated Vital Signs BP (!) 166/96   Pulse 84   Temp 98 F (36.7 C) (Oral)   Resp 15   SpO2 96%   Physical Exam  Constitutional: He is oriented to person, place, and time. He appears well-developed and well-nourished.  HENT:  Head: Normocephalic and atraumatic.  Mouth/Throat: Oropharynx is clear and moist. No oropharyngeal exudate.  Eyes: Pupils are equal, round, and reactive to light. EOM are normal.  No nystagmus  Neck: Normal range of motion. Neck supple. No JVD present.  Cardiovascular: Normal rate and regular rhythm. Exam reveals no gallop and no friction rub.  No murmur heard. Pulmonary/Chest: Effort normal and breath sounds normal. No stridor. No  respiratory distress. He has no wheezes. He has no rales. He exhibits no tenderness.  Abdominal: Soft. Bowel sounds are normal. There is tenderness. There is no rebound and no guarding.  Mild epigastric tenderness to palpation.  No rebound or guarding.  Musculoskeletal: Normal range of motion. He exhibits no edema or tenderness.  No lower extremity swelling, asymmetry or tenderness.  Lymphadenopathy:    He has no cervical adenopathy.  Neurological: He is alert and oriented to person, place, and time.  5/5 motor in all extremities.  Sensation fully intact.  Initially had tremor present but is improved during conversation.  Skin: Skin is warm and dry. No rash noted. He is not diaphoretic. No erythema.  Psychiatric:  Anxious appearing does not appear to be responding to internal stimuli.  No definite SI/HI  Nursing note and vitals reviewed.    ED Treatments / Results  Labs (all labs ordered are listed, but only abnormal results are displayed) Labs Reviewed  CBC WITH DIFFERENTIAL/PLATELET - Abnormal; Notable for the following components:      Result Value   WBC 11.0 (*)    Neutro Abs 8.2 (*)    All other components within normal limits  COMPREHENSIVE METABOLIC PANEL - Abnormal; Notable for the following components:   Glucose, Bld 158 (*)    BUN 5 (*)    All other components within normal limits  ETHANOL - Abnormal; Notable for the following components:   Alcohol, Ethyl (B) 86 (*)    All other components within normal limits  RAPID URINE DRUG SCREEN, HOSP PERFORMED - Abnormal; Notable for the following components:   Cocaine POSITIVE (*)    Benzodiazepines POSITIVE (*)    Tetrahydrocannabinol POSITIVE (*)    Barbiturates   (*)    Value: Result not available. Reagent lot number recalled by manufacturer.   All other components within normal limits  LIPASE, BLOOD    EKG None  Radiology No results found.  Procedures Procedures (including critical care time)  Medications  Ordered in ED Medications  LORazepam (ATIVAN) injection 0-4 mg (0 mg Intravenous Not Given 11/20/17 1119)    Or  LORazepam (ATIVAN) tablet 0-4 mg ( Oral See Alternative 11/20/17 1119)  LORazepam (ATIVAN) injection 0-4 mg (has no administration in time range)    Or  LORazepam (ATIVAN) tablet 0-4 mg (has no administration in time range)  thiamine (VITAMIN B-1) tablet 100 mg (100 mg Oral Given 11/20/17 1123)    Or  thiamine (B-1) injection 100 mg ( Intravenous See Alternative 11/20/17 1123)  sodium chloride 0.9 % bolus 1,000 mL (0 mLs Intravenous Stopped 11/20/17 1129)  LORazepam (ATIVAN) injection 1 mg (1 mg Intravenous Given 11/20/17 1002)     Initial Impression / Assessment and Plan / ED Course  I have reviewed the triage vital signs and the nursing notes.  Pertinent labs & imaging results that were available during my care of the patient were reviewed by me and considered in my medical decision making (see chart for details).     Patient symptoms mostly appear to be related to anxiety at  this point though he may have some early withdrawal.  Placed on CIWA protocol.  Patient is medically cleared for evaluation by psychiatry.  TTS to consult. Final Clinical Impressions(s) / ED Diagnoses   Final diagnoses:  Anxiety  Uncomplicated alcohol dependence [facility](HCC)    ED Discharge Orders    None       Loren RacerYelverton, Jaylise Peek, MD 11/20/17 1239

## 2017-11-20 NOTE — Progress Notes (Signed)
Patient ID: Russell Nichols, male   DOB: 30-May-1970, 47 y.o.   MRN: [id number] Patient admitted to the unit due to relapse on substances.  Patient currently denies SI and HI, but does report sometimes having a passive death wish.  Patient also reported seeing a person who other people do not see and who goes away when other people are around.  Patient reports hearing voices at times when he is alone, but isn't sure if they are hallucinations or if he is imagining things.  Patient reports a depressed mood, feeling of hopelessness due to relapse on alcohol and withdrawal symptoms.   Skin assessment complete and patient found to be free of all injury. No contraband noted.  Patient admitted to the unit without incident.

## 2017-11-20 NOTE — BH Assessment (Signed)
[facility] Assessment Progress Note      [facility] pending his medical clearance with medications being given to patient [facility] to reduce his potential for him going into DTs and to help get his blood pressure into a normal range.

## 2017-11-20 NOTE — ED Notes (Signed)
Lab notified need for blood add on to save tubes in lab

## 2017-11-21 ENCOUNTER — Inpatient Hospital Stay (HOSPITAL_COMMUNITY): Payer: Federal, State, Local not specified - Other

## 2017-11-21 LAB — CBC WITH DIFFERENTIAL/PLATELET
BASOS ABS: 0 10*3/uL (ref 0.0–0.1)
BASOS PCT: 0 %
Eosinophils Absolute: 0.2 10*3/uL (ref 0.0–0.7)
Eosinophils Relative: 2 %
HEMATOCRIT: 43.6 % (ref 39.0–52.0)
HEMOGLOBIN: 15 g/dL (ref 13.0–17.0)
Lymphocytes Relative: 25 %
Lymphs Abs: 2.3 10*3/uL (ref 0.7–4.0)
MCH: 31.6 pg (ref 26.0–34.0)
MCHC: 34.4 g/dL (ref 30.0–36.0)
MCV: 91.8 fL (ref 78.0–100.0)
Monocytes Absolute: 0.8 10*3/uL (ref 0.1–1.0)
Monocytes Relative: 9 %
NEUTROS ABS: 5.8 10*3/uL (ref 1.7–7.7)
NEUTROS PCT: 64 %
Platelets: 305 10*3/uL (ref 150–400)
RBC: 4.75 MIL/uL (ref 4.22–5.81)
RDW: 13 % (ref 11.5–15.5)
WBC: 9.1 10*3/uL (ref 4.0–10.5)

## 2017-11-21 LAB — ETHANOL
ALCOHOL ETHYL (B): 194 mg/dL — AB (ref <10)
Alcohol, Ethyl (B): 245 mg/dL — ABNORMAL HIGH (ref <10)

## 2017-11-21 LAB — BASIC METABOLIC PANEL
ANION GAP: 11 (ref 5–15)
BUN: 7 mg/dL (ref 6–20)
CALCIUM: 9.3 mg/dL (ref 8.9–10.3)
CHLORIDE: 110 mmol/L (ref 98–111)
CO2: 25 mmol/L (ref 22–32)
Creatinine, Ser: 1.18 mg/dL (ref 0.61–1.24)
GFR calc non Af Amer: >60 mL/min (ref >60)
Glucose, Bld: 92 mg/dL (ref 70–99)
Potassium: 4.3 mmol/L (ref 3.5–5.1)
Sodium: 146 mmol/L — ABNORMAL HIGH (ref 135–145)

## 2017-11-21 MED ORDER — CHLORDIAZEPOXIDE HCL 25 MG PO CAPS
25.0000 mg | ORAL_CAPSULE | Freq: Four times a day (QID) | ORAL | Status: AC | PRN
  Administered 2017-11-21: 25 mg via ORAL

## 2017-11-21 MED ORDER — CHLORDIAZEPOXIDE HCL 25 MG PO CAPS
25.0000 mg | ORAL_CAPSULE | Freq: Three times a day (TID) | ORAL | Status: AC
  Administered 2017-11-22 (×3): 25 mg via ORAL
  Filled 2017-11-21 (×3): qty 1

## 2017-11-21 MED ORDER — ADULT MULTIVITAMIN W/MINERALS CH
1.0000 | ORAL_TABLET | Freq: Every day | ORAL | Status: DC
  Administered 2017-11-21 – 2017-11-26 (×6): 1 via ORAL
  Filled 2017-11-21 (×8): qty 1

## 2017-11-21 MED ORDER — METFORMIN HCL ER 500 MG PO TB24
500.0000 mg | ORAL_TABLET | Freq: Two times a day (BID) | ORAL | Status: DC
  Filled 2017-11-21 (×2): qty 1

## 2017-11-21 MED ORDER — CHLORDIAZEPOXIDE HCL 25 MG PO CAPS
25.0000 mg | ORAL_CAPSULE | Freq: Four times a day (QID) | ORAL | Status: AC
  Administered 2017-11-21 (×3): 25 mg via ORAL
  Filled 2017-11-21 (×4): qty 1

## 2017-11-21 MED ORDER — ONDANSETRON 4 MG PO TBDP: 4.0000 mg | ORAL_TABLET | Freq: Four times a day (QID) | ORAL | Status: AC | PRN

## 2017-11-21 MED ORDER — LOPERAMIDE HCL 2 MG PO CAPS: 2.0000 mg | ORAL_CAPSULE | ORAL | Status: AC | PRN

## 2017-11-21 MED ORDER — CHLORDIAZEPOXIDE HCL 25 MG PO CAPS
25.0000 mg | ORAL_CAPSULE | Freq: Every day | ORAL | Status: AC
  Administered 2017-11-24: 25 mg via ORAL
  Filled 2017-11-21: qty 1

## 2017-11-21 MED ORDER — CHLORDIAZEPOXIDE HCL 25 MG PO CAPS
25.0000 mg | ORAL_CAPSULE | ORAL | Status: AC
  Administered 2017-11-23 (×2): 25 mg via ORAL
  Filled 2017-11-21 (×2): qty 1

## 2017-11-21 MED ORDER — PAROXETINE HCL 20 MG PO TABS
20.0000 mg | ORAL_TABLET | Freq: Every day | ORAL | Status: DC
  Administered 2017-11-21 – 2017-11-26 (×6): 20 mg via ORAL
  Filled 2017-11-21 (×5): qty 1
  Filled 2017-11-21: qty 2
  Filled 2017-11-21 (×2): qty 1
  Filled 2017-11-21: qty 7
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
## 2017-11-21 NOTE — Progress Notes (Signed)
Recreation Therapy Notes  INPATIENT RECREATION THERAPY ASSESSMENT  Patient Details Name: Russell HallsMichael Hayne MRN: [id number] DOB: Sep 12, 1970 Today's Date: 11/21/2017       Information Obtained From: Patient  Able to Participate in Assessment/Interview: Yes  Patient Presentation: Alert  Reason for Admission (Per Patient): Suicidal Ideation, Substance Abuse  Patient Stressors: Other (Comment), Work(Loss job; Anxiety)  Coping Skills:   Isolation, Self-Injury, TV, Music, Exercise, Substance Abuse, Talk, Prayer, Read, Hot Bath/Shower  Leisure Interests (2+):  Individual - Reading, ConocoPhillipsature - Fishing, Nature - Hiking, Technical brewerature - Other (Comment)(Biking)  Frequency of Recreation/Participation: Other (Comment)(Daily)  Awareness of Community Resources:  No  Community Resources:     Current Use:    If no, Barriers?:    Expressed Interest in [employer] CorporationCommunity Resource Information:    [place]  Patient Main Form of Transportation: Car  Patient Strengths:  Good listener: Knowledgeable  Patient Identified Areas of Improvement:  Humility; Honesty  Patient Goal for Hospitalization:  "To get into a long term facility"  Current SI (including self-harm):  No  Current HI:  No  Current AVH: No  Staff Intervention Plan: Group Attendance, Collaborate with Interdisciplinary Treatment Team  Consent to Intern Participation: N/A   Caroll RancherMarjette Jobeth Pangilinan, LRT/CTRS  Caroll RancherLindsay, Nyal Schachter A 11/21/2017, 2:14 PM

## 2017-11-21 NOTE — Progress Notes (Signed)
1:1 Note: Patient maintained on constant supervision for safety.  Patient is in his room sleeping. Respiration even and unlabored.  Routine safety checks maintained every 15 minutes.  Patient is safe on the unit with supervision.
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
## 2017-11-21 NOTE — Tx Team (Signed)
Interdisciplinary Treatment and Diagnostic Plan Update  11/21/2017 Time of Session: 8:26 AM  Russell Nichols MRN: [id number]  Principal Diagnosis: <principal problem not specified>  Secondary Diagnoses: Active Problems:   MDD (major depressive disorder), recurrent, severe, with psychosis ([place])   Current Medications:  Current Facility-Administered Medications  Medication Dose Route Frequency Provider Last Rate Last Dose  . acetaminophen (TYLENOL) tablet 650 mg  650 mg Oral Q6H PRN Ethelene Hal, NP      . alum & mag hydroxide-simeth (MAALOX/MYLANTA) 200-200-20 MG/5ML suspension 30 mL  30 mL Oral Q4H PRN Ethelene Hal, NP      . haloperidol (HALDOL) tablet 5 mg  5 mg Oral Q6H PRN Ethelene Hal, NP       And  . benztropine (COGENTIN) tablet 1 mg  1 mg Oral Q6H PRN Ethelene Hal, NP      . chlordiazePOXIDE (LIBRIUM) capsule 25 mg  25 mg Oral Q6H PRN Lindon Romp A, NP      . chlordiazePOXIDE (LIBRIUM) capsule 25 mg  25 mg Oral QID Rozetta Nunnery, NP       Followed by  . [START ON 11/22/2017] chlordiazePOXIDE (LIBRIUM) capsule 25 mg  25 mg Oral TID Rozetta Nunnery, NP       Followed by  . [START ON 11/23/2017] chlordiazePOXIDE (LIBRIUM) capsule 25 mg  25 mg Oral BH-qamhs Rozetta Nunnery, NP       Followed by  . [START ON 11/24/2017] chlordiazePOXIDE (LIBRIUM) capsule 25 mg  25 mg Oral Daily Lindon Romp A, NP      . diphenhydrAMINE (BENADRYL) 50 MG/ML injection           . haloperidol lactate (HALDOL) 5 MG/ML injection           . hydrOXYzine (ATARAX/VISTARIL) tablet 25 mg  25 mg Oral TID PRN Ethelene Hal, NP      . loperamide (IMODIUM) capsule 2-4 mg  2-4 mg Oral PRN Lindon Romp A, NP      . magnesium hydroxide (MILK OF MAGNESIA) suspension 30 mL  30 mL Oral Daily PRN Ethelene Hal, NP      . multivitamin with minerals tablet 1 tablet  1 tablet Oral Daily Lindon Romp A, NP      . ondansetron (ZOFRAN-ODT) disintegrating tablet 4 mg  4 mg Oral  Q6H PRN Lindon Romp A, NP      . thiamine (VITAMIN B-1) tablet 100 mg  100 mg Oral Daily Ethelene Hal, NP      . traZODone (DESYREL) tablet 50 mg  50 mg Oral QHS PRN Ethelene Hal, NP        PTA Medications: Medications Prior to Admission  Medication Sig Dispense Refill Last Dose  . acetaminophen (TYLENOL) 650 MG CR tablet Take 650 mg by mouth every 8 (eight) hours as needed for pain.   Past Week at Unknown time  . cetirizine (ZYRTEC) 10 MG tablet Take 10 mg by mouth daily.   Past Week at Unknown time  . escitalopram (LEXAPRO) 20 MG tablet Take 20 mg by mouth daily.  5 Past Week at Unknown time  . QUEtiapine (SEROQUEL) 50 MG tablet Take 25 mg by mouth daily.   Past Week at Unknown time    Patient Stressors:    Patient Strengths:    Treatment Modalities: Medication Management, Group therapy, Case management,  1 to 1 session with clinician, Psychoeducation, Recreational therapy.   Physician Treatment Plan for Primary Diagnosis: <  principal problem not specified> Long Term Goal(s): Improvement in symptoms so as ready for discharge  Short Term Goals:    Medication Management: Evaluate patient's response, side effects, and tolerance of medication regimen.  Therapeutic Interventions: 1 to 1 sessions, Unit Group sessions and Medication administration.  Evaluation of Outcomes: Progressing  Physician Treatment Plan for Secondary Diagnosis: Active Problems:   MDD (major depressive disorder), recurrent, severe, with psychosis ([place])   Long Term Goal(s): Improvement in symptoms so as ready for discharge  Short Term Goals:    Medication Management: Evaluate patient's response, side effects, and tolerance of medication regimen.  Therapeutic Interventions: 1 to 1 sessions, Unit Group sessions and Medication administration.  Evaluation of Outcomes: Progressing   RN Treatment Plan for Primary Diagnosis: <principal problem not specified> Long Term Goal(s): Knowledge of  disease and therapeutic regimen to maintain health will improve  Short Term Goals: Ability to disclose and discuss suicidal ideas, Ability to identify and develop effective coping behaviors will improve and Compliance with prescribed medications will improve  Medication Management: RN will administer medications as ordered by provider, will assess and evaluate patient's response and provide education to patient for prescribed medication. RN will report any adverse and/or side effects to prescribing provider.  Therapeutic Interventions: 1 on 1 counseling sessions, Psychoeducation, Medication administration, Evaluate responses to treatment, Monitor vital signs and CBGs as ordered, Perform/monitor CIWA, COWS, AIMS and Fall Risk screenings as ordered, Perform wound care treatments as ordered.  Evaluation of Outcomes: Progressing   LCSW Treatment Plan for Primary Diagnosis: <principal problem not specified> Long Term Goal(s): Safe transition to appropriate next level of care at discharge, Engage patient in therapeutic group addressing interpersonal concerns.  Short Term Goals: Engage patient in aftercare planning with referrals and resources  Therapeutic Interventions: Assess for all discharge needs, 1 to 1 time with Social worker, Explore available resources and support systems, Assess for adequacy in community support network, Educate family and significant other(s) on suicide prevention, Complete Psychosocial Assessment, Interpersonal group therapy.  Evaluation of Outcomes: Met  Go to [facility] from here   Progress in Treatment: Attending groups: Yes Participating in groups: Yes Taking medication as prescribed: Yes Toleration medication: Yes, no side effects reported at this time Family/Significant other contact made: No Patient understands diagnosis: Yes AEB asking for help with addiction Discussing patient identified problems/goals with staff: Yes Medical problems stabilized or resolved:  Yes Denies suicidal/homicidal ideation: Yes Issues/concerns per patient self-inventory: None Other: N/A  New problem(s) identified: None identified at this time.   New Short Term/Long Term Goal(s): "I've lost everything.  My cousin runs [facility], and she is working to get me in there from here.  If I don't go somewhere, I'm not going to be around anymore.  My anxiety has always been high too."   Discharge Plan or Barriers:   Reason for Continuation of Hospitalization: Anxiety Depression Hallucinations Medication stabilization Withdrawal symptoms  Estimated Length of Stay: 7/17  Attendees: Patient: Russell Nichols 11/21/2017  8:26 AM  Physician: Melba Coon, MD 11/21/2017  8:26 AM  Nursing: Sena Hitch, RN 11/21/2017  8:26 AM  RN Care Manager: Lars Pinks, RN 11/21/2017  8:26 AM  Social Worker: Ripley Fraise 11/21/2017  8:26 AM  Recreational Therapist: Winfield Cunas 11/21/2017  8:26 AM  Other: Norberto Sorenson 11/21/2017  8:26 AM  Other:  11/21/2017  8:26 AM    Scribe for Treatment Team:  Roque Lias LCSW 11/21/2017 8:26 AM

## 2017-11-21 NOTE — ED Notes (Signed)
[facility] at bedside for transport.  

## 2017-11-21 NOTE — Progress Notes (Addendum)
Patient ID: Russell Nichols, male   DOB: [birth year], 47 y.o.   MRN: [id number] Patient was [facility] this afternoon with alcohol abuse disorder. Pt was fine all evening chatting and laughing with peers; Pt denied any anxiety, depression, pain or AVH. At about 1120pm Pt came out of his room complaining of anxiety; refused medication. Pt then became fearful, "Please don't hurt me, please help me" Pt was then becoming agitated while interaction with people that were not there. Pt suddenly walked toward his room pushing a staff member out of the way then purposefully hit head against the wall multiple times. It took multiple staff members to restraint Pt. Pt was retrained to the chair (@1140pm ) and given order PRN injections-see MAR. Pt was then released and sent to the ED for possible alcohol withdrawal and head injury at 1225 am. Pt emergency contact Chrystie NoseJason Yates was called but phone was out of service.

## 2017-11-21 NOTE — Progress Notes (Signed)
1: 1 Note: Patient maintained on constant supervision for safety.  Patient visible in the dayroom watching TV.  Gait is slow but steady.  Medications given as prescribed.  Routine safety checks maintained every 15 minutes checks.  Support and encouragement offered as needed.  Patient is safe with supervision.

## 2017-11-21 NOTE — Progress Notes (Signed)
CSW left [facility] information with tech assigned as pt's 1:1, as pt was sleeping this afternoon. Pt was instructed to contact [facility] to completed phone interview. He is trying to find his cousin's phone number (who works for [employer]) in order for CSW to coordinate aftercare. Pt has not found this number currently.  Russell Nichols S. Russell Nichols, MSW, LCSW Clinical Social Worker 11/21/2017 2:35 PM '

## 2017-11-21 NOTE — ED Notes (Signed)
Charge nurse spoke with nurse at [facility]. Requesting repeat ETOH level.

## 2017-11-21 NOTE — [facility] Group Notes (Signed)
Adult Psychoeducational Group Note  Date:  11/21/2017 Time:  9:08 PM  Group Topic/Focus:  Wrap-Up Group:   The focus of this group is to help patients review their daily goal of treatment and discuss progress on daily workbooks.  Participation Level:  Active  Participation Quality:  Appropriate  Affect:  Blunted  Cognitive:  Appropriate  Insight: Lacking  Engagement in Group:  Lacking  Modes of Intervention:  Discussion  Additional Comments:  Patient shared that he is not having a good day because he is sore and "everything sucks." Patient did request sleep aid for tonight.   Lyndee HensenGoins, Cait Locust R 11/21/2017, 9:08 PM

## 2017-11-21 NOTE — [facility] Suicide Risk Assessment (Signed)
[facility] INPATIENT:  Family/Significant Other Suicide Prevention Education  Suicide Prevention Education:  Patient Refusal for Family/Significant Other Suicide Prevention Education: The patient Russell HallsMichael Nichols has refused to provide written consent for family/significant other to be provided Family/Significant Other Suicide Prevention Education during admission and/or prior to discharge.  Physician notified.  SPE completed with pt, as pt refused to consent to family contact. SPI pamphlet provided to pt and pt was encouraged to share information with support network, ask questions, and talk about any concerns relating to SPE. Pt denies access to guns/firearms and verbalized understanding of information provided. Mobile Crisis information also provided to pt.   Rona RavensHeather S Markee Remlinger LCSW 11/21/2017, 2:35 PM

## 2017-11-21 NOTE — ED Provider Notes (Signed)
[facility]-EMERGENCY DEPT Provider Note: Russell DellJ. Lane Danni Shima, MD, FACEP  CSN: [id number] MRN: [id number] ARRIVAL: 11/21/17 at 0039 ROOM: RESA/RESA   CHIEF COMPLAINT  Altered Mental Status  Level 5 caveat: Altered mental status HISTORY OF PRESENT ILLNESS  11/21/17 12:50 AM Russell Nichols is a 47 y.o. male with history of polysubstance abuse who was seen in the ED yesterday and diagnosed with delirium tremens and substance-induced mood disorder.  He was transferred to behavioral health hospital yesterday afternoon.  He was sent back to the ED from behavioral health after he became agitated, becoming combative with staff and falling to the floor and hitting his head multiple times.  He was given a total of 50 mg of Benadryl IM, 2 mg of Ativan IM and Haldol 5 mg IM with partial improvement in his agitation.  At the present time he is unable to give a history.  He is noted to be moving all extremities.   Past Medical History:  Diagnosis Date  . Alcoholism (HCC)     History reviewed. No pertinent surgical history.  Family History  Family history unknown: Yes    Social History   Tobacco Use  . Smoking status: Former Games developermoker  . Smokeless tobacco: Current User  Substance Use Topics  . Alcohol use: Yes    Alcohol/week: 50.4 oz    Types: 84 Cans of beer per week  . Drug use: Yes    Types: Marijuana    Prior to Admission medications   Medication Sig Start Date End Date Taking? Authorizing Provider  acetaminophen (TYLENOL) 650 MG CR tablet Take 650 mg by mouth every 8 (eight) hours as needed for pain.    [provider]  cetirizine (ZYRTEC) 10 MG tablet Take 10 mg by mouth daily.    [provider]  escitalopram (LEXAPRO) 20 MG tablet Take 20 mg by mouth daily. 10/16/17   [provider]  QUEtiapine (SEROQUEL) 50 MG tablet Take 25 mg by mouth daily.    [provider]    Allergies Penicillins and Amoxicillin   REVIEW OF SYSTEMS     PHYSICAL  EXAMINATION  Initial Vital Signs Blood pressure 122/74, pulse 76, temperature 99 F (37.2 C), temperature source Oral, resp. rate 16, height 6' (1.829 m), weight 85.7 kg (189 lb), SpO2 97 %.  Examination General: Well-developed, well-nourished male in no acute distress; appearance consistent with age of record HENT: normocephalic; no obvious trauma seen or palpated Eyes: pupils equal, round and reactive to light; extraocular muscles intact Neck: Immobilized in cervical collar; patient unable to say whethe or not his neck is tender to palpation Heart: regular rate and rhythm Lungs: clear to auscultation bilaterally Abdomen: soft; nondistended; bowel sounds present Extremities: No deformity; full range of motion; pulses normal Neurologic: Somnolent but arousable; motor function intact in all extremities and symmetric; no facial droop Skin: Warm and dry Psychiatric: Agitated when awake; incomprehensible speech   RESULTS  Summary of this visit's results, reviewed by myself:   EKG Interpretation  Date/Time:    Ventricular Rate:    PR Interval:    QRS Duration:   QT Interval:    QTC Calculation:   R Axis:     Text Interpretation:        Laboratory Studies: Results for orders placed or performed during the hospital encounter of 11/20/17 (from the past 24 hour(s))  CBC with Differential/Platelet     Status: None   Collection Time: 11/21/17  1:27 AM  Result Value Ref Range  WBC 9.1 4.0 - 10.5 K/uL   RBC 4.75 4.22 - 5.81 MIL/uL   Hemoglobin 15.0 13.0 - 17.0 g/dL   HCT 96.0 45.4 - 09.8 %   MCV 91.8 78.0 - 100.0 fL   MCH 31.6 26.0 - 34.0 pg   MCHC 34.4 30.0 - 36.0 g/dL   RDW 11.9 14.7 - 82.9 %   Platelets 305 150 - 400 K/uL   Neutrophils Relative % 64 %   Neutro Abs 5.8 1.7 - 7.7 K/uL   Lymphocytes Relative 25 %   Lymphs Abs 2.3 0.7 - 4.0 K/uL   Monocytes Relative 9 %   Monocytes Absolute 0.8 0.1 - 1.0 K/uL   Eosinophils Relative 2 %   Eosinophils Absolute 0.2 0.0 - 0.7  K/uL   Basophils Relative 0 %   Basophils Absolute 0.0 0.0 - 0.1 K/uL  Basic metabolic panel     Status: Abnormal   Collection Time: 11/21/17  1:27 AM  Result Value Ref Range   Sodium 146 (H) 135 - 145 mmol/L   Potassium 4.3 3.5 - 5.1 mmol/L   Chloride 110 98 - 111 mmol/L   CO2 25 22 - 32 mmol/L   Glucose, Bld 92 70 - 99 mg/dL   BUN 7 6 - 20 mg/dL   Creatinine, Ser 5.62 0.61 - 1.24 mg/dL   Calcium 9.3 8.9 - 13.0 mg/dL   GFR calc non Af Amer >60 >60 mL/min   GFR calc Af Amer >60 >60 mL/min   Anion gap 11 5 - 15  Ethanol     Status: Abnormal   Collection Time: 11/21/17  1:27 AM  Result Value Ref Range   Alcohol, Ethyl (B) 245 (H) <10 mg/dL   Imaging Studies: Dg Chest 2 View  Result Date: 11/21/2017 CLINICAL DATA:  47 year old male with trauma. Areas of opacities noted in the upper lung on the earlier CT of the cervical spine. Patient is combative. EXAM: CHEST - 2 VIEW COMPARISON:  Cervical spine CT dated 11/21/2017 FINDINGS: There is shallow inspiration with minimal bibasilar atelectasis. No focal consolidation, pleural effusion, or pneumothorax. The cardiac silhouette is within normal limits. No acute osseous pathology. IMPRESSION: No acute cardiopulmonary process. The visualized opacities on the earlier cervical spine CT are not visualized on radiograph. Electronically Signed   By: Elgie Collard M.D.   On: 11/21/2017 03:00   Ct Head Wo Contrast  Result Date: 11/21/2017 CLINICAL DATA:  47 year old male with C-spine trauma. EXAM: CT HEAD WITHOUT CONTRAST CT CERVICAL SPINE WITHOUT CONTRAST TECHNIQUE: Multidetector CT imaging of the head and cervical spine was performed following the standard protocol without intravenous contrast. Multiplanar CT image reconstructions of the cervical spine were also generated. COMPARISON:  The CT dated 11/20/2010 FINDINGS: CT HEAD FINDINGS Brain: The ventricles and sulci appropriate size for patient's age. Minimal periventricular and deep white matter  chronic microvascular ischemic changes noted. There is no acute intracranial hemorrhage. No mass effect or midline shift. No extra-axial fluid collection. Vascular: No hyperdense vessel or unexpected calcification. Skull: Normal. Negative for fracture or focal lesion. Sinuses/Orbits: No acute finding. Other: Left posterior parietal/occipital minimal scalp contusion. CT CERVICAL SPINE FINDINGS Alignment: No acute subluxation. Minimal reversal of normal cervical lordosis which may be positional or due to muscle spasm or related to degenerative changes. Skull base and vertebrae: No acute fracture. Soft tissues and spinal canal: No prevertebral fluid or swelling. No visible canal hematoma. Disc levels: Degenerative changes primarily involving the lower cervical spine at C5-C7. Upper chest: Patchy  areas of airspace densities in the visualized upper lobes and apical regions bilaterally, right greater left may represent pulmonary contusions although pneumonia is not excluded. Clinical correlation is recommended. Chest CT may provide better evaluation if clinically indicated. Other: None IMPRESSION: 1. No acute intracranial pathology. 2. No acute/traumatic cervical spine pathology. 3. Bilateral upper lobe/apical opacities may represent pneumonia or contusion. Electronically Signed   By: Elgie Collard M.D.   On: 11/21/2017 02:04   Ct Cervical Spine Wo Contrast  Result Date: 11/21/2017 CLINICAL DATA:  47 year old male with C-spine trauma. EXAM: CT HEAD WITHOUT CONTRAST CT CERVICAL SPINE WITHOUT CONTRAST TECHNIQUE: Multidetector CT imaging of the head and cervical spine was performed following the standard protocol without intravenous contrast. Multiplanar CT image reconstructions of the cervical spine were also generated. COMPARISON:  The CT dated 11/20/2010 FINDINGS: CT HEAD FINDINGS Brain: The ventricles and sulci appropriate size for patient's age. Minimal periventricular and deep white matter chronic microvascular  ischemic changes noted. There is no acute intracranial hemorrhage. No mass effect or midline shift. No extra-axial fluid collection. Vascular: No hyperdense vessel or unexpected calcification. Skull: Normal. Negative for fracture or focal lesion. Sinuses/Orbits: No acute finding. Other: Left posterior parietal/occipital minimal scalp contusion. CT CERVICAL SPINE FINDINGS Alignment: No acute subluxation. Minimal reversal of normal cervical lordosis which may be positional or due to muscle spasm or related to degenerative changes. Skull base and vertebrae: No acute fracture. Soft tissues and spinal canal: No prevertebral fluid or swelling. No visible canal hematoma. Disc levels: Degenerative changes primarily involving the lower cervical spine at C5-C7. Upper chest: Patchy areas of airspace densities in the visualized upper lobes and apical regions bilaterally, right greater left may represent pulmonary contusions although pneumonia is not excluded. Clinical correlation is recommended. Chest CT may provide better evaluation if clinically indicated. Other: None IMPRESSION: 1. No acute intracranial pathology. 2. No acute/traumatic cervical spine pathology. 3. Bilateral upper lobe/apical opacities may represent pneumonia or contusion. Electronically Signed   By: Elgie Collard M.D.   On: 11/21/2017 02:04    ED COURSE and MDM  Nursing notes and initial vitals signs, including pulse oximetry, reviewed.  Vitals:   11/20/17 1700 11/20/17 2330 11/21/17 0059 11/21/17 0124  BP: 122/74 126/88 94/60 94/60   Pulse: 76 98 78 80  Resp: 16 (!) 21 15   Temp: 99 F (37.2 C) 98.1 F (36.7 C)  97.7 F (36.5 C)  TempSrc: Oral Oral  Oral  SpO2: 97%  90% 92%  Weight: 85.7 kg (189 lb)     Height: 6' (1.829 m)      3:03 AM Patient still confused and attempts to ambulate with poor success.  It is noted that his blood alcohol yesterday morning was 86 mg/dL.  This morning his blood alcohol is noted to be 245 mg/dL.  This  could only lead to the conclusion that he has been drinking alcohol at [facility].  It is unclear if he snuck alcohol in or if he has been drinking something like handwashing gel.  3:59 AM Patient now awake, alert and cooperative.  He is eating a sandwich.  I believe he is able to be transferred back to behavioral health.  They will be advised of our findings and that he should be observed for surreptitious alcohol consumption.  PROCEDURES    ED DIAGNOSES     ICD-10-CM   1. Alcohol intoxication in active alcoholic with delirium (HCC) F10.221   2. Minor head injury, initial encounter S09.90XA  3. Fall during current hospitalization, initial encounter [id number].Lorne Skeens    Z61.096        [name] Libra, MD 11/21/17 0403

## 2017-11-21 NOTE — Progress Notes (Signed)
Recreation Therapy Notes  Date: 7.12.19 Time: 1000 Location: 500 Hall Dayroom  Group Topic: Leisure Education, Goal Setting  Goal Area(s) Addresses:  Patient will be able to identify at least 3 goals for leisure participation.  Patient will be able to identify benefit of investing in leisure participation.  Patient will be able to identify benefit of setting leisure goals.   Intervention: Worksheet  Activity: Garment/textile technologistGoal Planning.  Patients were to set goals for the next week, month, year and next five years.  Patients were to then identify obstacles, what they need to achieve their goals and what they can start doing tomorrow to work towards their goals.  Education:  Discharge Planning, PharmacologistCoping Skills, Leisure Education   Education Outcome: Acknowledges Education/In Group Clarification Provided/Needs Additional Education  Clinical Observations: Pt did not attend group.    Caroll RancherMarjette Keiland Pickering, LRT/CTRS         Caroll RancherLindsay, Angellynn Kimberlin A 11/21/2017 12:24 PM
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
## 2017-11-21 NOTE — ED Triage Notes (Signed)
[facility] started banging his head against the walls and floor and becoming combative to staff Staff wanted him evaluated in the ED for delirium and possible alcohol withdrawal
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
## 2017-11-21 NOTE — [facility] Group Notes (Signed)
LCSW Group Therapy Note  11/21/2017 1:15pm  Type of Therapy/Topic:  Group Therapy:  Feelings about Diagnosis  Participation Level:  Did Not Attend   Description of Group:   This group will allow patients to explore their thoughts and feelings about diagnoses they have received. Patients will be guided to explore their level of understanding and acceptance of these diagnoses. Facilitator will encourage patients to process their thoughts and feelings about the reactions of others to their diagnosis and will guide patients in identifying ways to discuss their diagnosis with significant others in their lives. This group will be process-oriented, with patients participating in exploration of their own experiences, giving and receiving support, and processing challenge from other group members.   Therapeutic Goals: 1. Patient will demonstrate understanding of diagnosis as evidenced by identifying two or more symptoms of the disorder 2. Patient will be able to express two feelings regarding the diagnosis 3. Patient will demonstrate their ability to communicate their needs through discussion and/or role play  Summary of Patient Progress:       Therapeutic Modalities:   Cognitive Behavioral Therapy Brief Therapy Feelings Identification    Ida RogueRodney B Jamella Grayer, LCSW 11/21/2017 4:17 PM

## 2017-11-21 NOTE — Progress Notes (Signed)
1:1 Note: Patient maintained on constant supervision for safety.  Presents with anxious affect and mood.  Patient visible in milieu watching TV.  Medication given as prescribed.  Routine safety checks maintained every 15 minutes.  Support and encouragement offered.  Patient is safe on the unit.

## 2017-11-21 NOTE — ED Notes (Addendum)
[facility] Carney Livingobi[facility] RN stating that [facility] comes over due to alcohol level. This Clinical research associatewriter was informed by the nurse that there was an empty bottle of hand sanitizer found in the patient's room.

## 2017-11-21 NOTE — ED Notes (Signed)
[facility] called for transport. 
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
## 2017-11-22 DIAGNOSIS — F333 Major depressive disorder, recurrent, severe with psychotic symptoms: Principal | ICD-10-CM

## 2017-11-22 MED ORDER — DM-GUAIFENESIN ER 30-600 MG PO TB12
1.0000 | ORAL_TABLET | Freq: Two times a day (BID) | ORAL | Status: DC
  Administered 2017-11-22 – 2017-11-26 (×8): 1 via ORAL
  Filled 2017-11-22 (×11): qty 1

## 2017-11-22 NOTE — Progress Notes (Signed)
[facility] Post 1:1 Observation Documentation  For the first (8) hours following discontinuation of 1:1 precautions, a progress note entry by nursing staff should be documented at least every 2 hours, reflecting the patient's behavior, condition, mood, and conversation.  Use the progress notes for additional entries.  Time 1:1 discontinued: 1221   Patient's Behavior:  Calm  Patient's Condition:  safe  Patient's Conversation:  Seated in the dayroom watching TV.  Russell PunchesJane O Raheim Nichols 11/22/2017, 7:07 PM

## 2017-11-22 NOTE — Progress Notes (Signed)
[facility] Post 1:1 Observation Documentation  For the first (8) hours following discontinuation of 1:1 precautions, a progress note entry by nursing staff should be documented at least every 2 hours, reflecting the patient's behavior, condition, mood, and conversation.  Use the progress notes for additional entries.  Time 1:1 discontinued:  1221  Patient's Behavior:  Calm  Patient's Condition:  safe  Patient's Conversation:  Stated he here because he need help with ETOH.  Bethann PunchesJane O Lometa Riggin 11/22/2017, 6:18 PM

## 2017-11-22 NOTE — Progress Notes (Signed)
1:1 note  Pt at this time is in bed resting with eyes closed. Pt does not look to be in any distress at this time. 1:1 staff is present in room with Pt at this time. 1:1 monitoring continues for Pt's safety. 15-minute safety checks also continues at this time.

## 2017-11-22 NOTE — Progress Notes (Signed)
[facility] Post 1:1 Observation Documentation  For the first (8) hours following discontinuation of 1:1 precautions, a progress note entry by nursing staff should be documented at least every 2 hours, reflecting the patient's behavior, condition, mood, and conversation.  Use the progress notes for additional entries.  Time 1:1 discontinued:  1221  Patient's Behavior:  Calm and copperative  Patient's Condition:  Safe and steady on his feet  Patient's Conversation:  " I feel good being off 1:1, pt went to the cafeteria and ate, no problems reported.  Bethann PunchesJane O Sieara Bremer 11/22/2017, 1:00 PM

## 2017-11-22 NOTE — Progress Notes (Signed)
1:1 Note Pt in the room at this time talking to staff. Pt stated this morning he is still having some effects from drinking handsernitiser  And given chance, he will never do it again though pt admitted he has a history of it. Pt is calm and cooperative at this time, steady on his feet, no unwanted behavior observed or reported. Will continue to monitor.

## 2017-11-22 NOTE — Progress Notes (Signed)
[facility] Post 1:1 Observation Documentation  For the first (8) hours following discontinuation of 1:1 precautions, a progress note entry by nursing staff should be documented at least every 2 hours, reflecting the patient's behavior, condition, mood, and conversation.  Use the progress notes for additional entries.  Time 1:1 discontinued:  1221  Patient's Behavior:  Calm  Patient's Condition:  Safe in the dayroom watching TV.  Patient's Conversation:  Pt stated he feel free.  Bethann PunchesJane O Leslee Suire 11/22/2017, 3:32 PM

## 2017-11-22 NOTE — [facility] Group Notes (Signed)
  [facility] LCSW Group Therapy Note  Date/Time:  11/22/2017 11:15AM-12:00PM  Type of Therapy and Topic:  Group Therapy:  Feelings About Hospitalization  Participation Level:  Active   Description of Group This process group involved patients discussing their feelings related to being hospitalized, as well as the benefits they see to being in the hospital.  These feelings and benefits were itemized.  The group then brainstormed specific ways in which they could seek those same benefits when they discharge and return home.  Therapeutic Goals 1. Patient will identify and describe positive and negative feelings related to hospitalization 2. Patient will verbalize benefits of hospitalization to themselves personally 3. Patients will brainstorm together ways they can obtain similar benefits in the outpatient setting, identify barriers to wellness and possible solutions  Summary of Patient Progress:  The patient expressed his primary feelings about being hospitalized are that he is glad to be in the hospital because it means he is not dead.  He shared some insightful thoughts about taking medication as a tool, and talked about his experience as a peer.  He wants to go to long-term rehab, he stated.  Therapeutic Modalities Cognitive Behavioral Therapy Motivational Interviewing    Ambrose MantleMareida Grossman-Orr, LCSW 11/22/2017, 5:17 PM

## 2017-11-22 NOTE — Plan of Care (Signed)
  Problem: Activity: Goal: Interest or engagement in activities will improve Outcome: Progressing   Problem: Coping: Goal: Ability to verbalize frustrations and anger appropriately will improve Outcome: Progressing   Problem: Safety: Goal: Periods of time without injury will increase Outcome: Progressing   

## 2017-11-22 NOTE — Progress Notes (Addendum)
[facility] MD Progress Note  11/22/2017 3:49 PM Russell Nichols   MRN:  [id number] Subjective: Russell Nichols seen on a one-to-one for safety.  Was reported patient ingested hand sanitizer.  Patient was medically cleared and initiated 1:1.  Per staff patient has been calm and cooperative.  Denies alcohol withdrawal symptoms.  Russell Nichols denies alcohol cravings.  Reports taking medications as prescribed and tolerating them well.  Denies suicidal homicidal ideations.  Will discontinue one-to-one close observation during the day.  Patient reports he has a plan to follow-up with [facility], reports he has a scheduled phone and review on Monday.  However states if he is unable to get an to his this long-term residential program he would like to speak with the social worker regarding "Plan B"  Denies depression or depressive symptoms reports a good appetite states he is resting well.  Support encouragement reassurance was provided.  History: Russell Nichols an 47 y.o.malewho is a chronic alcoholic with current psychosis and vague suicidal ideation. 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 Patient has experienced up to five years of sobriety at a time, but is a chronic relapsed. He states that he relapsed two years ago and he has been drinking up to sixteen beers daily with his last use being this morning. Patient states that he has a history of DT's and seizures. Patient states that he is prescribed CBD oil in a vape and states that he uses 2000 mg weekly. He states that he uses this to manage his pain issues. Patient states that several years ago that a friend showed up in his life and revealed himself to the patient. He states that the friend is not real, but he sees him and states that he is able to carry on conversations with him.    Principal Problem: MDD (major depressive disorder), recurrent, severe, with psychosis ([place]) Diagnosis:   Patient Active Problem  List   Diagnosis Date Noted  . MDD (major depressive disorder), recurrent, severe, with psychosis ([place]) [F33.3] 11/20/2017  . Substance induced mood disorder ([place]) [F19.94]   . Alcohol dependence with uncomplicated withdrawal ([place]) [F10.230] 11/16/2017  . Tachycardia [R00.0] 11/16/2017  . Alcohol intoxication ([place]) [F10.929] 11/16/2017  . Leukocytosis [D72.829] 11/16/2017  . Thrombocytopenia ([place]) [D69.6] 11/16/2017   Total Time spent with patient: 20 minutes  Past Psychiatric History:   Past Medical History:  Past Medical History:  Diagnosis Date  . Alcoholism ([place])    History reviewed. No pertinent surgical history. Family History:  Family History  Family history unknown: Yes   Family Psychiatric  History:  Social History:  Social History   Substance and Sexual Activity  Alcohol Use Yes  . Alcohol/week: 50.4 oz  . Types: 84 Cans of beer per week     Social History   Substance and Sexual Activity  Drug Use Yes  . Types: Marijuana    Social History   Socioeconomic History  . Marital status: Single    Spouse name: Not on file  . Number of children: Not on file  . Years of education: Not on file  . Highest education level: Not on file  Occupational History  . Not on file  Social Needs  . Financial resource strain: Not on file  . Food insecurity:    Worry: Not on file    Inability: Not on file  . Transportation needs:    Medical: Not on file    Non-medical: Not on file  Tobacco Use  . Smoking status: Former Research scientist (life sciences)  . Smokeless tobacco: Current User  Substance and Sexual Activity  . Alcohol use: Yes    Alcohol/week: 50.4 oz    Types: 84 Cans of beer per week  . Drug use: Yes    Types: Marijuana  . Sexual activity: Not on file  Lifestyle  . Physical activity:    Days per week: Not on file    Minutes per session: Not on file  . Stress: Not on file  Relationships  . Social connections:    Talks on phone: Not on file    Gets together: Not on file     Attends religious service: Not on file    Active member of club or organization: Not on file    Attends meetings of clubs or organizations: Not on file    Relationship status: Not on file  Other Topics Concern  . Not on file  Social History Narrative  . Not on file   Additional Social History:                         Sleep: Fair  Appetite:  Fair  Current Medications: Current Facility-Administered Medications  Medication Dose Route Frequency Provider Last Rate Last Dose  . acetaminophen (TYLENOL) tablet 650 mg  650 mg Oral Q6H PRN Ethelene Hal, NP      . alum & mag hydroxide-simeth (MAALOX/MYLANTA) 200-200-20 MG/5ML suspension 30 mL  30 mL Oral Q4H PRN Ethelene Hal, NP      . haloperidol (HALDOL) tablet 5 mg  5 mg Oral Q6H PRN Ethelene Hal, NP       And  . benztropine (COGENTIN) tablet 1 mg  1 mg Oral Q6H PRN Ethelene Hal, NP      . chlordiazePOXIDE (LIBRIUM) capsule 25 mg  25 mg Oral Q6H PRN Lindon Romp A, NP   25 mg at 11/21/17 1203  . chlordiazePOXIDE (LIBRIUM) capsule 25 mg  25 mg Oral TID Lindon Romp A, NP   25 mg at 11/22/17 1319   Followed by  . [START ON 11/23/2017] chlordiazePOXIDE (LIBRIUM) capsule 25 mg  25 mg Oral BH-qamhs Rozetta Nunnery, NP       Followed by  . [START ON 11/24/2017] chlordiazePOXIDE (LIBRIUM) capsule 25 mg  25 mg Oral Daily Lindon Romp A, NP      . dextromethorphan-guaiFENesin ([place] DM) 30-600 MG per 12 hr tablet 1 tablet  1 tablet Oral BID Derrill Center, NP      . hydrOXYzine (ATARAX/VISTARIL) tablet 25 mg  25 mg Oral TID PRN Ethelene Hal, NP   25 mg at 11/21/17 2156  . loperamide (IMODIUM) capsule 2-4 mg  2-4 mg Oral PRN Lindon Romp A, NP      . magnesium hydroxide (MILK OF MAGNESIA) suspension 30 mL  30 mL Oral Daily PRN Ethelene Hal, NP      . multivitamin with minerals tablet 1 tablet  1 tablet Oral Daily Lindon Romp A, NP   1 tablet at 11/22/17 0811  . ondansetron (ZOFRAN-ODT)  disintegrating tablet 4 mg  4 mg Oral Q6H PRN Rozetta Nunnery, NP      . PARoxetine (PAXIL) tablet 20 mg  20 mg Oral Daily Lavella Hammock, MD   20 mg at 11/22/17 0811  . thiamine (VITAMIN B-1) tablet 100 mg  100 mg Oral Daily Ethelene Hal, NP   100 mg at  11/22/17 2423  . traZODone (DESYREL) tablet 50 mg  50 mg Oral QHS PRN Ethelene Hal, NP   50 mg at 11/21/17 2156    Lab Results:  Results for orders placed or performed during the hospital encounter of 11/20/17 (from the past 48 hour(s))  CBC with Differential/Platelet     Status: None   Collection Time: 11/21/17  1:27 AM  Result Value Ref Range   WBC 9.1 4.0 - 10.5 K/uL   RBC 4.75 4.22 - 5.81 MIL/uL   Hemoglobin 15.0 13.0 - 17.0 g/dL   HCT 43.6 39.0 - 52.0 %   MCV 91.8 78.0 - 100.0 fL   MCH 31.6 26.0 - 34.0 pg   MCHC 34.4 30.0 - 36.0 g/dL   RDW 13.0 11.5 - 15.5 %   Platelets 305 150 - 400 K/uL   Neutrophils Relative % 64 %   Neutro Abs 5.8 1.7 - 7.7 K/uL   Lymphocytes Relative 25 %   Lymphs Abs 2.3 0.7 - 4.0 K/uL   Monocytes Relative 9 %   Monocytes Absolute 0.8 0.1 - 1.0 K/uL   Eosinophils Relative 2 %   Eosinophils Absolute 0.2 0.0 - 0.7 K/uL   Basophils Relative 0 %   Basophils Absolute 0.0 0.0 - 0.1 K/uL    Comment: Performed at [facility], [place] [address] [place], [place] [address]  Basic metabolic panel     Status: Abnormal   Collection Time: 11/21/17  1:27 AM  Result Value Ref Range   Sodium 146 (H) 135 - 145 mmol/L    Comment: DELTA CHECK NOTED   Potassium 4.3 3.5 - 5.1 mmol/L   Chloride 110 98 - 111 mmol/L    Comment: Please note change in reference range.   CO2 25 22 - 32 mmol/L   Glucose, Bld 92 70 - 99 mg/dL    Comment: Please note change in reference range.   BUN 7 6 - 20 mg/dL    Comment: Please note change in reference range.   Creatinine, Ser 1.18 0.61 - 1.24 mg/dL   Calcium 9.3 8.9 - 10.3 mg/dL   GFR calc non Af Amer >60 >60 mL/min   GFR calc Af Amer >60 >60  mL/min    Comment: (NOTE) The eGFR has been calculated using the CKD EPI equation. This calculation has not been validated in all clinical situations. eGFR's persistently <60 mL/min signify possible Chronic Kidney Disease.    Anion gap 11 5 - 15    Comment: Performed at [facility], [place] [address] [place], [place] [address]  Ethanol     Status: Abnormal   Collection Time: 11/21/17  1:27 AM  Result Value Ref Range   Alcohol, Ethyl (B) 245 (H) <10 mg/dL    Comment: (NOTE) Lowest detectable limit for serum alcohol is 10 mg/dL. For medical purposes only. Performed at [facility], [place] [address], [place], [place] [address]   Ethanol     Status: Abnormal   Collection Time: 11/21/17  4:22 AM  Result Value Ref Range   Alcohol, Ethyl (B) 194 (H) <10 mg/dL    Comment: (NOTE) Lowest detectable limit for serum alcohol is 10 mg/dL. For medical purposes only. Performed at [facility], [place] [address], [place],  [address]     Blood Alcohol level:  Lab Results  Component Value Date   ETH 194 (H) 11/21/2017   ETH 245 (H) 93/26/7124    Metabolic Disorder Labs: No  results found for: HGBA1C, MPG No results found for: PROLACTIN No results found for: CHOL, TRIG, HDL, CHOLHDL, VLDL, LDLCALC  Physical Findings: AIMS:  , ,  ,  ,    CIWA:  CIWA-Ar Total: 0 COWS:     Musculoskeletal: Strength & Muscle Tone: within normal limits Gait & Station: normal Patient leans: N/A  Psychiatric Specialty Exam: Physical Exam  Nursing note and vitals reviewed. Constitutional: He appears well-developed.  Psychiatric: He has a normal mood and affect. His behavior is normal.    Review of Systems  Psychiatric/Behavioral: Positive for depression and substance abuse. The patient is nervous/anxious.   All other systems reviewed and are negative.   Blood pressure (!) 140/106, pulse 72, temperature 97.7 F (36.5 C), temperature source  Oral, resp. rate 15, height 6' (1.829 m), weight 85.7 kg (189 lb), SpO2 92 %.Body mass index is 25.63 kg/m.  General Appearance: Casual  Eye Contact:  Fair  Speech:  Clear and Coherent  Volume:  Normal  Mood:  Anxious and Depressed  Affect:  Congruent  Thought Process:  Coherent  Orientation:  Full (Time, Place, and Person)  Thought Content:  Hallucinations: None  Suicidal Thoughts:  No  Homicidal Thoughts:  No  Memory:  Immediate;   Fair Recent;   Fair Remote;   Fair  Judgement:  Fair  Insight:  Present  Psychomotor Activity:  Normal  Concentration:  Concentration: Fair  Recall:  [employer] of Knowledge:  Fair  Language:  Fair  Akathisia:  No  Handed:  Right  AIMS (if indicated):     Assets:  Communication Skills Desire for Improvement Resilience Social Support  ADL's:  Intact  Cognition:  WNL  Sleep:        Treatment Plan Summary: Daily contact with patient to assess and evaluate symptoms and progress in treatment and Medication management   Continue with current treatment plan on 11/22/2017 as listed below except were noted  Discontinue one-to-one Continue with paxil 79m for mood stabilization. Continue with Trazodone 100 mg for insomnia  Started on CWIA/ Librium Protocol Will continue to monitor vitals ,medication compliance and treatment side effects while patient is here.    See chart for agitation protocol  CSW will continue working on disposition.  Patient to participate in therapeutic milieu  TDerrill Center NP 11/22/2017, 3:49 PM   ..Agree with NP Progress Note

## 2017-11-22 NOTE — Progress Notes (Signed)
Patient ID: Russell HallsMichael Nichols, male   DOB: [birth year], 47 y.o.   MRN: [id number]  DAR Note: Pt in dayroom interacting with 1:1 staff. Pt continue to endorse generalized weakness, "I still feel very weak all over." Pt continue to endorse moderate anxiety, depression and mild generalized body pain. Pt was med compliant. All patient's questions and concerns addressed. Support, encouragement, and safe environment provided. 15-minute safety checks continue. Pt attended wrap-up group.

## 2017-11-22 NOTE — Progress Notes (Signed)
Patient ID: Russell HallsMichael Mungia, male   DOB: 1970-06-03, 47 y.o.   MRN: [id number] Pt at this time is in bed resting with eyes closed. Pt does not look to be in any distress at this time. 1:1 staff is present in room with Pt at this time. 1:1 monitoring continues for Pt's safety. 15-minute safety checks also continues at this time.

## 2017-11-22 NOTE — [facility] Group Notes (Signed)
[facility] Group Notes:  (Nursing/MHT/Case Management/Adjunct)  Date:  11/22/2017  Time:  5:32 PM  Type of Therapy:  Nurse Education  Participation Level:  Active  Participation Quality:  Appropriate, Attentive and Sharing  Affect:  Appropriate and Excited  Cognitive:  Alert and Appropriate  Insight:  Good  Engagement in Group:  Engaged and Supportive  Modes of Intervention:  Discussion and Exploration  Summary of Progress/Problems: In this group, the RNs discussed current problems, stressors, or life challenges. This included suicidal thoughts, alcohol and drug abuse, and emotional struggles. The patient was very active in group, sharing his experiences and advice. He started out mildly irritable and guarded, but quickly opened up and spent a good deal of time sharing.  Kirstie MirzaJonathan C Maytal Mijangos 11/22/2017, 5:32 PM

## 2017-11-23 MED ORDER — ACAMPROSATE CALCIUM 333 MG PO TBEC
666.0000 mg | DELAYED_RELEASE_TABLET | Freq: Three times a day (TID) | ORAL | Status: DC
  Administered 2017-11-23 – 2017-11-24 (×4): 666 mg via ORAL
  Filled 2017-11-23 (×10): qty 2

## 2017-11-23 NOTE — [facility] Group Notes (Signed)
[facility]  Date/Time:  11/23/2017  11:00AM-12:00PM  Type of Therapy and Topic:  Group Therapy:  Music and Mood  Participation Level:  Active   Description of Group: In this process group, members listened to a variety of genres of music and identified that different types of music evoke different responses.  Patients were encouraged to identify music that was soothing for them and music that was energizing for them.  Patients discussed how this knowledge can help with wellness and recovery in various ways including managing depression and anxiety as well as encouraging healthy sleep habits.    Therapeutic Goals: 1. Patients will explore the impact of different varieties of music on mood 2. Patients will verbalize the thoughts they have when listening to different types of music 3. Patients will identify music that is soothing to them as well as music that is energizing to them 4. Patients will discuss how to use this knowledge to assist in maintaining wellness and recovery 5. Patients will explore the use of music as a coping skill  Summary of Patient Progress:  At the beginning of group, patient was not present, but when he entered he said he had been in his room vomiting and his stomach hurt.  At the end of group, he said "music feeds the soul" and said he felt better, no longer sick.  Therapeutic Modalities: Solution Focused Brief Therapy Activity   Ambrose MantleMareida Grossman-Orr, LCSW

## 2017-11-23 NOTE — Progress Notes (Signed)
Patient ID: Russell HallsMichael Mutz, male   DOB: 09/14/1970, 47 y.o.   MRN: [id number] DAR Note: Pt observed in the dayroom watching TV and interacting with peer. Pt continue to endorse generalized weakness, "I still feel very weak all over." Pt also continue to endorse moderate anxiety and depression. Pt denied SI/HI or AVH. Pt was med compliant. All patient's questions and concerns addressed. Support, encouragement, and safe environment provided. 15-minute safety checks continue. Pt attended wrap-up group.

## 2017-11-23 NOTE — Plan of Care (Signed)
D: Pt denies SI/HI/AVH. Pt is pleasant and cooperative. Pt stated he was having too much issues getting too involved with the people he was helping. [facility] , pt has good insight into his Tx.   A: Pt was offered support and encouragement. Pt was given scheduled medications. Pt was encourage to attend groups. Q 15 minute checks were done for safety.   R:Pt attends groups and interacts well with peers and staff. Pt is taking medication. Pt has no complaints.Pt receptive to treatment and safety maintained on unit.   Problem: Education: Goal: Emotional status will improve Outcome: Progressing   Problem: Education: Goal: Mental status will improve Outcome: Progressing   Problem: Education: Goal: Verbalization of understanding the information provided will improve Outcome: Progressing   Problem: Activity: Goal: Interest or engagement in activities will improve Outcome: Progressing   Problem: Coping: Goal: Ability to verbalize frustrations and anger appropriately will improve Outcome: Progressing   Problem: Health Behavior/Discharge Planning: Goal: Compliance with treatment plan for underlying cause of condition will improve Outcome: Progressing

## 2017-11-23 NOTE — Progress Notes (Signed)
D. Pt isolative to room for most of the morning- pt friendly and cooperative during interactions. Per pt's self inventory, pt rates his depression, hopelessness and anxiety a 6/1/7, respectively. Pt reports some minor withdrawal symptoms (sweating, agitation, runny nose). Pt currently denies SI/HI and AV hallucinations A. Labs and vitals monitored. Pt is compliant with medications. Pt supported emotionally and encouraged to express concerns and ask questions.   R. Pt remains safe with 15 minute checks. Will continue POC.

## 2017-11-23 NOTE — Progress Notes (Addendum)
[facility]  11/23/2017 10:37 AM Russell Nichols   MRN:  [id number]   Subjective: Russell NeedleMichael seen resting in bed. Reports feeling slightly better during this assessment.  Continues to deny cravings for alcohol however does state that he obsesses thoughts about alcohol. discussed initiating Campral 666mg   patient was agreeable to treatment plan.  Continues to ruminate with discharge disposition as he reports he is unable to go back to the streets after this admission. States  he needs "door-to-door" treatment for long-term rehabilitation.  Continues to present to deny suicidal or homicidal ideations.  Denies depression or depressive symptoms.  Reports taking medications as prescribed and tolerating them well.  Support encouragement reassurance was provided. Support encouragement reassurance was provided.  History: Russell Nichols an 47 y.o.malewho is a chronic alcoholic with current psychosis and vague suicidal ideation. 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 Patient has experienced up to five years of sobriety at a time, but is a chronic relapsed. He states that he relapsed two years ago and he has been drinking up to sixteen beers daily with his last use being this morning. Patient states that he has a history of DT's and seizures. Patient states that he is prescribed CBD oil in a vape and states that he uses 2000 mg weekly. He states that he uses this to manage his pain issues. Patient states that several years ago that a friend showed up in his life and revealed himself to the patient. He states that the friend is not real, but he sees him and states that he is able to carry on conversations with him.    Principal Problem: MDD (major depressive disorder), recurrent, severe, with psychosis (HCC) Diagnosis:   Patient Active Problem List   Diagnosis Date Noted  . MDD (major depressive disorder), recurrent, severe, with  psychosis (HCC) [F33.3] 11/20/2017  . Substance induced mood disorder (HCC) [F19.94]   . Alcohol dependence with uncomplicated withdrawal (HCC) [F10.230] 11/16/2017  . Tachycardia [R00.0] 11/16/2017  . Alcohol intoxication (HCC) [F10.929] 11/16/2017  . Leukocytosis [D72.829] 11/16/2017  . Thrombocytopenia (HCC) [D69.6] 11/16/2017   Total Time spent with patient: 20 minutes  Past Psychiatric History:   Past Medical History:  Past Medical History:  Diagnosis Date  . Alcoholism (HCC)    History reviewed. No pertinent surgical history. Family History:  Family History  Family history unknown: Yes   Family Psychiatric  History:  Social History:  Social History   Substance and Sexual Activity  Alcohol Use Yes  . Alcohol/week: 50.4 oz  . Types: 84 Cans of beer per week     Social History   Substance and Sexual Activity  Drug Use Yes  . Types: Marijuana    Social History   Socioeconomic History  . Marital status: Single    Spouse name: Not on file  . Number of children: Not on file  . Years of education: Not on file  . Highest education level: Not on file  Occupational History  . Not on file  Social Needs  . Financial resource strain: Not on file  . Food insecurity:    Worry: Not on file    Inability: Not on file  . Transportation needs:    Medical: Not on file    Non-medical: Not on file  Tobacco Use  . Smoking status: Former Games developermoker  . Smokeless tobacco: Current User  Substance and Sexual Activity  . Alcohol use: Yes  Alcohol/week: 50.4 oz    Types: 84 Cans of beer per week  . Drug use: Yes    Types: Marijuana  . Sexual activity: Not on file  Lifestyle  . Physical activity:    Days per week: Not on file    Minutes per session: Not on file  . Stress: Not on file  Relationships  . Social connections:    Talks on phone: Not on file    Gets together: Not on file    Attends religious service: Not on file    Active member of club or organization: Not on  file    Attends meetings of clubs or organizations: Not on file    Relationship status: Not on file  Other Topics Concern  . Not on file  Social History Narrative  . Not on file   Additional Social History:                         Sleep: Fair  Appetite:  Fair  Current Medications: Current Facility-Administered Medications  Medication Dose Route Frequency Provider Last Rate Last Dose  . acetaminophen (TYLENOL) tablet 650 mg  650 mg Oral Q6H PRN Laveda Abbe, NP      . alum & mag hydroxide-simeth (MAALOX/MYLANTA) 200-200-20 MG/5ML suspension 30 mL  30 mL Oral Q4H PRN Laveda Abbe, NP      . haloperidol (HALDOL) tablet 5 mg  5 mg Oral Q6H PRN Laveda Abbe, NP       And  . benztropine (COGENTIN) tablet 1 mg  1 mg Oral Q6H PRN Laveda Abbe, NP      . chlordiazePOXIDE (LIBRIUM) capsule 25 mg  25 mg Oral Q6H PRN Nira Conn A, NP   25 mg at 11/21/17 1203  . chlordiazePOXIDE (LIBRIUM) capsule 25 mg  25 mg Oral BH-qamhs Nira Conn A, NP   25 mg at 11/23/17 0720   Followed by  . [START ON 11/24/2017] chlordiazePOXIDE (LIBRIUM) capsule 25 mg  25 mg Oral Daily Nira Conn A, NP      . dextromethorphan-guaiFENesin (MUCINEX DM) 30-600 MG per 12 hr tablet 1 tablet  1 tablet Oral BID Oneta Rack, NP   1 tablet at 11/23/17 0719  . hydrOXYzine (ATARAX/VISTARIL) tablet 25 mg  25 mg Oral TID PRN Laveda Abbe, NP   25 mg at 11/22/17 2158  . loperamide (IMODIUM) capsule 2-4 mg  2-4 mg Oral PRN Nira Conn A, NP      . magnesium hydroxide (MILK OF MAGNESIA) suspension 30 mL  30 mL Oral Daily PRN Laveda Abbe, NP      . multivitamin with minerals tablet 1 tablet  1 tablet Oral Daily Nira Conn A, NP   1 tablet at 11/23/17 0719  . ondansetron (ZOFRAN-ODT) disintegrating tablet 4 mg  4 mg Oral Q6H PRN Jackelyn Poling, NP      . PARoxetine (PAXIL) tablet 20 mg  20 mg Oral Daily Mariel Craft, MD   20 mg at 11/23/17 0719  . thiamine  (VITAMIN B-1) tablet 100 mg  100 mg Oral Daily Laveda Abbe, NP   100 mg at 11/23/17 0719  . traZODone (DESYREL) tablet 50 mg  50 mg Oral QHS PRN Laveda Abbe, NP   50 mg at 11/22/17 2158    Lab Results:  No results found for this or any previous visit (from the past 48 hour(s)).  Blood Alcohol level:  Lab Results  Component Value Date   ETH 194 (H) 11/21/2017   ETH 245 (H) 11/21/2017    Metabolic Disorder Labs: No results found for: HGBA1C, MPG No results found for: PROLACTIN No results found for: CHOL, TRIG, HDL, CHOLHDL, VLDL, LDLCALC  Physical Findings: AIMS:  , ,  ,  ,    CIWA:  CIWA-Ar Total: 4 COWS:     Musculoskeletal: Strength & Muscle Tone: within normal limits Gait & Station: normal Patient leans: N/A  Psychiatric Specialty Exam: Physical Exam  Nursing note and vitals reviewed. Constitutional: He appears well-developed.  Psychiatric: He has a normal mood and affect. His behavior is normal.    Review of Systems  Psychiatric/Behavioral: Positive for depression and substance abuse. Negative for hallucinations and suicidal ideas. The patient is nervous/anxious.   All other systems reviewed and are negative.   Blood pressure 119/89, pulse 66, temperature (!) 97.5 F (36.4 C), temperature source Oral, resp. rate 16, height 6' (1.829 m), weight 85.7 kg (189 lb), SpO2 92 %.Body mass index is 25.63 kg/m.  General Appearance: Casual and Guarded  Eye Contact:  Fair  Speech:  Clear and Coherent  Volume:  Normal  Mood:  Anxious and Depressed  Affect:  Congruent  Thought Process:  Coherent  Orientation:  Full (Time, Place, and Person)  Thought Content:  Hallucinations: None  Suicidal Thoughts:  No  Homicidal Thoughts:  No  Memory:  Immediate;   Fair Recent;   Fair Remote;   Fair  Judgement:  Fair  Insight:  Present  Psychomotor Activity:  Normal  Concentration:  Concentration: Fair  Recall:  [employer] of Knowledge:  Fair  Language:  Fair   Akathisia:  No  Handed:  Right  AIMS (if indicated):     Assets:  Communication Skills Desire for Improvement Resilience Social Support  ADL's:  Intact  Cognition:  WNL  Sleep:  Number of Hours: 6.25     Treatment Plan Summary: Daily contact with patient to assess and evaluate symptoms and progress in treatment and Medication management   Continue with current treatment plan on 11/23/2017 as listed below except were noted  Discontinue one-to-one Initiate Campral 66 mg p.o. 3 times daily for alcohol cravings Continue with paxil 20mg  for mood stabilization. Continue with Trazodone 100 mg for insomnia  Started on CWIA/ Librium Protocol Will continue to monitor vitals ,medication compliance and treatment side effects while patient is here.    See chart for agitation protocol  CSW will continue working on disposition.  Patient to participate in therapeutic milieu  Oneta Rack, NP 11/23/2017, 10:37 AM    ..Agree with NP Progress Note

## 2017-11-24 NOTE — Progress Notes (Signed)
Patient ID: Russell HallsMichael Kallenberger, male   DOB: 01/06/1971, 47 y.o.   MRN: [id number] D: Patient observed watching TV and interacting well with peers on approach. Pt is calm and cooperative. Pt reports he is doing well and waiting for discharge to another treatmen center which he has been accepted. Pt attended evening wrap up group and engaged in discussion. Denies  SI/HI/AVH and pain.No behavioral issues noted.  A: Support and encouragement offered as needed to express needs. Medications administered as prescribed.  R: Patient is safe and cooperative on unit. Will continue to monitor  for safety and stability.

## 2017-11-24 NOTE — Plan of Care (Signed)
  Problem: Education: Goal: Emotional status will improve Outcome: Progressing   Problem: Activity: Goal: Interest or engagement in activities will improve Outcome: Progressing   Problem: Safety: Goal: Periods of time without injury will increase Outcome: Progressing  DAR NOTE: Patient presents with calm affect and  mood.  Denies suicidal thoughts, pain, auditory and visual hallucinations.  Rates depression at 0, hopelessness at 0, and anxiety at 0.  Maintained on routine safety checks.  Medications given as prescribed.  Support and encouragement offered as needed.  Attended group and participated.  States goal for today is [facility]."  Patient observed socializing with peers in the dayroom.  Offered no complaint.

## 2017-11-24 NOTE — [facility] Group Notes (Signed)
LCSW Group Therapy Note   11/24/2017 1:15pm   Type of Therapy and Topic:  Group Therapy:  Overcoming Obstacles   Participation Level:  Active   Description of Group:    In this group patients will be encouraged to explore what they see as obstacles to their own wellness and recovery. They will be guided to discuss their thoughts, feelings, and behaviors related to these obstacles. The group will process together ways to cope with barriers, with attention given to specific choices patients can make. Each patient will be challenged to identify changes they are motivated to make in order to overcome their obstacles. This group will be process-oriented, with patients participating in exploration of their own experiences as well as giving and receiving support and challenge from other group members.   Therapeutic Goals: 1. Patient will identify personal and current obstacles as they relate to admission. 2. Patient will identify barriers that currently interfere with their wellness or overcoming obstacles.  3. Patient will identify feelings, thought process and behaviors related to these barriers. 4. Patient will identify two changes they are willing to make to overcome these obstacles:      Summary of Patient Progress   Stayed the entire time, engaged throughout.  Talked about "My biggest obstacle is another potential relapse, which is why I plan to go directly to a long term program from here. I have to double down on this because I have done it before, but the program was not as long.  I want to do this because it's the only way for me to get my life back, and have a chance at meeting any of the goals I have for myself."   Therapeutic Modalities:   Cognitive Behavioral Therapy Solution Focused Therapy Motivational Interviewing Relapse Prevention Therapy  Russell RogueRodney B Russell Kuipers, LCSW 11/24/2017 3:37 PM

## 2017-11-24 NOTE — Progress Notes (Signed)
[facility] MD Progress Note  11/24/2017 6:53 PM Russell Nichols   MRN:  [id number]   Subjective: Russell Nichols seen resting in bed. Reports feeling slightly better during this assessment.  He is hopeful for discharge planning and has a meeting with [facility] today for placement for substance abuse treatment. Continues to deny cravings for alcohol and reports Campral 666mg  has not been effective. Continues to present to deny suicidal or homicidal ideations.  Denies depression or depressive symptoms.  Reports taking medications as prescribed and tolerating them well.  Support encouragement reassurance was provided. Support encouragement reassurance was provided.  History: Russell Nichols an 47 y.o.malewho is a chronic alcoholic with current psychosis and vague suicidal ideation. 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 Patient has experienced up to five years of sobriety at a time, but is a chronic relapsed. He states that he relapsed two years ago and he has been drinking up to sixteen beers daily with his last use being this morning. Patient states that he has a history of DT's and seizures. Patient states that he is prescribed CBD oil in a vape and states that he uses 2000 mg weekly. He states that he uses this to manage his pain issues. Patient states that several years ago that a friend showed up in his life and revealed himself to the patient. He states that the friend is not real, but he sees him and states that he is able to carry on conversations with him.    Principal Problem: MDD (major depressive disorder), recurrent, severe, with psychosis (HCC) Diagnosis:   Patient Active Problem List   Diagnosis Date Noted  . MDD (major depressive disorder), recurrent, severe, with psychosis (HCC) [F33.3] 11/20/2017  . Substance induced mood disorder (HCC) [F19.94]   . Alcohol dependence with uncomplicated withdrawal (HCC) [F10.230] 11/16/2017  .  Tachycardia [R00.0] 11/16/2017  . Alcohol intoxication (HCC) [F10.929] 11/16/2017  . Leukocytosis [D72.829] 11/16/2017  . Thrombocytopenia (HCC) [D69.6] 11/16/2017   Total Time spent with patient: 45 minutes  Past Psychiatric History:  See H&P  Past Medical History:  Past Medical History:  Diagnosis Date  . Alcoholism (HCC)    History reviewed. No pertinent surgical history. Family History:  Family History  Family history unknown: Yes   Family Psychiatric  History:  Social History:  Social History   Substance and Sexual Activity  Alcohol Use Yes  . Alcohol/week: 50.4 oz  . Types: 84 Cans of beer per week     Social History   Substance and Sexual Activity  Drug Use Yes  . Types: Marijuana    Social History   Socioeconomic History  . Marital status: Single    Spouse name: Not on file  . Number of children: Not on file  . Years of education: Not on file  . Highest education level: Not on file  Occupational History  . Not on file  Social Needs  . Financial resource strain: Not on file  . Food insecurity:    Worry: Not on file    Inability: Not on file  . Transportation needs:    Medical: Not on file    Non-medical: Not on file  Tobacco Use  . Smoking status: Former Games developer  . Smokeless tobacco: Current User  Substance and Sexual Activity  . Alcohol use: Yes    Alcohol/week: 50.4 oz    Types: 84 Cans of beer per week  . Drug use: Yes  Types: Marijuana  . Sexual activity: Not on file  Lifestyle  . Physical activity:    Days per week: Not on file    Minutes per session: Not on file  . Stress: Not on file  Relationships  . Social connections:    Talks on phone: Not on file    Gets together: Not on file    Attends religious service: Not on file    Active member of club or organization: Not on file    Attends meetings of clubs or organizations: Not on file    Relationship status: Not on file  Other Topics Concern  . Not on file  Social History  Narrative  . Not on file   Additional Social History:         See H&P                Sleep: Fair  Appetite:  Fair  Current Medications: Current Facility-Administered Medications  Medication Dose Route Frequency Provider Last Rate Last Dose  . acetaminophen (TYLENOL) tablet 650 mg  650 mg Oral Q6H PRN Laveda AbbeParks, Laurie Britton, NP      . alum & mag hydroxide-simeth (MAALOX/MYLANTA) 200-200-20 MG/5ML suspension 30 mL  30 mL Oral Q4H PRN Laveda AbbeParks, Laurie Britton, NP      . haloperidol (HALDOL) tablet 5 mg  5 mg Oral Q6H PRN Laveda AbbeParks, Laurie Britton, NP       And  . benztropine (COGENTIN) tablet 1 mg  1 mg Oral Q6H PRN Laveda AbbeParks, Laurie Britton, NP      . dextromethorphan-guaiFENesin [facility](MUCINEX DM) 30-600 MG per 12 hr tablet 1 tablet  1 tablet Oral BID Oneta RackLewis, Tanika N, NP   1 tablet at 11/24/17 1622  . hydrOXYzine (ATARAX/VISTARIL) tablet 25 mg  25 mg Oral TID PRN Laveda AbbeParks, Laurie Britton, NP   25 mg at 11/23/17 2233  . magnesium hydroxide (MILK OF MAGNESIA) suspension 30 mL  30 mL Oral Daily PRN Laveda AbbeParks, Laurie Britton, NP      . multivitamin with minerals tablet 1 tablet  1 tablet Oral Daily Nira ConnBerry, Jason A, NP   1 tablet at 11/24/17 0825  . PARoxetine (PAXIL) tablet 20 mg  20 mg Oral Daily Mariel CraftMaurer, Monike Bragdon M, MD   20 mg at 11/24/17 0825  . thiamine (VITAMIN B-1) tablet 100 mg  100 mg Oral Daily Laveda AbbeParks, Laurie Britton, NP   100 mg at 11/24/17 0825  . traZODone (DESYREL) tablet 50 mg  50 mg Oral QHS PRN Laveda AbbeParks, Laurie Britton, NP   50 mg at 11/23/17 2233    Lab Results:  No results found for this or any previous visit (from the past 48 hour(s)).  Blood Alcohol level:  Lab Results  Component Value Date   ETH 194 (H) 11/21/2017   ETH 245 (H) 11/21/2017    Metabolic Disorder Labs: No results found for: HGBA1C, MPG No results found for: PROLACTIN No results found for: CHOL, TRIG, HDL, CHOLHDL, VLDL, LDLCALC  Physical Findings: AIMS:  , ,  ,  ,    CIWA:  CIWA-Ar Total: 0 COWS:      Musculoskeletal: Strength & Muscle Tone: within normal limits Gait & Station: normal Patient leans: N/A  Psychiatric Specialty Exam: Physical Exam  Nursing note and vitals reviewed. Constitutional: He appears well-developed.  Psychiatric: He has a normal mood and affect. His behavior is normal.    Review of Systems  Psychiatric/Behavioral: Positive for depression and substance abuse. Negative for hallucinations and suicidal ideas. The patient is  nervous/anxious.   All other systems reviewed and are negative.   Blood pressure (!) 120/98, pulse 76, temperature 98.7 F (37.1 C), temperature source Oral, resp. rate 18, height 6' (1.829 m), weight 85.7 kg (189 lb), SpO2 98 %.Body mass index is 25.63 kg/m.  General Appearance: Casual  Eye Contact:  Fair  Speech:  Clear and Coherent  Volume:  Normal  Mood:  Anxious and Depressed  Affect:  Congruent  Thought Process:  Coherent  Orientation:  Full (Time, Place, and Person)  Thought Content:  Hallucinations: None  Suicidal Thoughts:  No  Homicidal Thoughts:  No  Memory:  Immediate;   Fair Recent;   Fair Remote;   Fair  Judgement:  Fair  Insight:  Present  Psychomotor Activity:  Normal  Concentration:  Concentration: Fair  Recall:  [employer] of Knowledge:  Fair  Language:  Fair  Akathisia:  No  Handed:  Right  AIMS (if indicated):     Assets:  Communication Skills Desire for Improvement Resilience Social Support  ADL's:  Intact  Cognition:  WNL  Sleep:  Number of Hours: 5.5     Treatment Plan Summary: Daily contact with patient to assess and evaluate symptoms and progress in treatment and Medication management   Continue with current treatment plan on 11/23/2017 as listed below except were noted  Discontinue one-to-one Discontinue Campral 66 mg p.o. 3 times daily for alcohol cravings Continue with Paxil 20mg  for mood stabilization. Continue with Trazodone 100 mg for insomnia  Continue  CWIA/ Librium  Protocol Will continue to monitor vitals ,medication compliance and treatment side effects while patient is here.    See chart for agitation protocol  CSW will continue working on disposition.  Patient to participate in therapeutic milieu  Mariel Craft, MD 11/24/2017, 6:53 PM    ..Agree with NP Progress Note

## 2017-11-24 NOTE — Progress Notes (Addendum)
Recreation Therapy Notes  Date: 7.15.19 Time: 1000 Location: 500 Dayroom  Group Topic: Coping Skills  Goal Area(s) Addresses:  Pt will able to identify positive coping skills. Pt will able to identify benefits of using coping skills post d/c.  Behavioral Response: Engaged  Intervention: Worksheet    Activity: Technical brewerCoping Skills Mind map.  LRT and patients filled in the first 8 boxes (anger, depression, stress, attitude/low self-esteem, manic, communication, alcohol and isolation) together.  Patients were to then identify 3 coping skills for each situation before coming back together as a group.  Education: PharmacologistCoping Skills, Building control surveyorDischarge Planning.   Education Outcome: Acknowledges understanding/In group clarification offered/Needs additional education.   Clinical Observations/Feedback: Pt was bright and active throughout group.  Pt identified his coping skills as talk somebody, thing of something to do for someone else, sex, do brain teasers, read dictionary, create hobbies, exercise, plan a trip and deep breathing.   Caroll RancherMarjette Marlow Hendrie, LRT/CTRS         Lillia AbedLindsay, Jaidin Ugarte A 11/24/2017 1:07 PM

## 2017-11-25 NOTE — [facility] Suicide Risk Assessment (Signed)
[facility] INPATIENT:  Family/Significant Other Suicide Prevention Education  Suicide Prevention Education:  Education Completed; Russell DriverMike Nichols [phone]  has been identified by the patient as the family member/significant other with whom the patient will be residing, and identified as the person(s) who will aid the patient in the event of a mental health crisis (suicidal ideations/suicide attempt).  With written consent from the patient, the family member/significant other has been provided the following suicide prevention education, prior to the and/or following the discharge of the patient.  The suicide prevention education provided includes the following:  Suicide risk factors  Suicide prevention and interventions  National Suicide Hotline telephone number  [facility] assessment telephone number  [facility] Assistance 911  [facility] and/or [facility] telephone number  Request made of family/significant other to:  Remove weapons (e.Nichols., guns, rifles, knives), all items previously/currently identified as safety concern.    Remove drugs/medications (over-the-counter, prescriptions, illicit drugs), all items previously/currently identified as a safety concern.  The family member/significant other verbalizes understanding of the suicide prevention education information provided.  The family member/significant other agrees to remove the items of safety concern listed above. Per Dr, called this friend of pt's to confirm plan of housing at d/c.  Left v/m.  In meantime, pt was talking to father Russell Nichols, and asked me to talk to father.  Father states he is planning on picking patient up tomorrow, helping him gather his belongings in [place] and then taking him to friend's home, where [facility]  Clarified for father that pt is not yet accepted.  Russell Nichols [facility] 11/25/2017, 10:28 AM

## 2017-11-25 NOTE — Progress Notes (Signed)
Recreation Therapy Notes  Date: 7.16.19 Time: 1000 Location: 500 Hall Dayroom  Group Topic: Anger Management  Goal Area(s) Addresses:  Patient will identify triggers for anger.  Patient will identify physical reaction to anger.   Patient will identify benefit of using coping skills when angry.  Behavioral Response: Engaged  Intervention: Worksheet  Activity: Introduction to Anger Management.  Patients were to identify 3 situations, topics or people that lead to feelings of anger; what they do when angry and the problems they have run into because of anger.  Education: Anger Management, Discharge Planning   Education Outcome: Acknowledges education/In group clarification offered/Needs additional education.   Clinical Observations/Feedback: Pt stated his anger goes in stages from mad to anger and then rage.  Pt stated the news, people who are closed minded and people wasting his time cause his anger.  When angry, pt stated he gets anxious, turns his back on the person and tries to relax to determine the underlying cause of his anger.  Pt stated his anger only causes problems when he is drunk.    Russell RancherMarjette Sung Nichols, LRT/CTRS      Russell AbedLindsay, Russell Nichols A 11/25/2017 11:52 AM

## 2017-11-25 NOTE — Progress Notes (Signed)
Adult Psychoeducational Group Note  Date:  11/25/2017 Time:  8:49 PM  Group Topic/Focus:  Wrap-Up Group:   The focus of this group is to help patients review their daily goal of treatment and discuss progress on daily workbooks.  Participation Level:  Active  Participation Quality:  Appropriate  Affect:  Appropriate  Cognitive:  Appropriate  Insight: Appropriate  Engagement in Group:  Engaged  Modes of Intervention:  Discussion  Additional Comments:  The patient expressed that he attended groups and  preparing for discharge.The patient also said that he rates today a 7.  Octavio Mannshigpen, Dereke Neumann Lee 11/25/2017, 8:49 PM

## 2017-11-25 NOTE — [facility] Group Notes (Signed)
LCSW Group Therapy Note   11/25/2017 1:15pm   Type of Therapy and Topic:  Group Therapy:  Positive Affirmations   Participation Level:  Did Not Attend  Description of Group: This group addressed positive affirmation toward self and others. Patients went around the room and identified two positive things about themselves and two positive things about a peer in the room. Patients reflected on how it felt to share something positive with others, to identify positive things about themselves, and to hear positive things from others. Patients were encouraged to have a daily reflection of positive characteristics or circumstances.  Therapeutic Goals 1. Patient will verbalize two of their positive qualities 2. Patient will demonstrate empathy for others by stating two positive qualities about a peer in the group 3. Patient will verbalize their feelings when voicing positive self affirmations and when voicing positive affirmations of others 4. Patients will discuss the potential positive impact on their wellness/recovery of focusing on positive traits of self and others. Summary of Patient Progress:    Therapeutic Modalities Cognitive Behavioral Therapy Motivational Interviewing  Russell Nichols, [place] 11/25/2017 2:01 PM

## 2017-11-25 NOTE — Progress Notes (Signed)
[facility] MD Progress Note  11/25/2017 5:49 PM Russell Nichols   MRN:  [id number]   Subjective: Chart reviewed and discussed with treatment team. Russell Nichols reports feeling better and ready for discharge. He intends to get admitted to  [facility]  for substance abuse treatment. Continues to deny cravings for alcohol. Continues to present to deny suicidal or homicidal ideations.  Denies depression or depressive symptoms.  Reports taking medications as prescribed and tolerating them well.  Support encouragement reassurance was provided. Support encouragement reassurance was provided.  History: Russell Nichols an 47 y.o.malewho is a chronic alcoholic with current psychosis and vague suicidal ideation. 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 Patient has experienced up to five years of sobriety at a time, but is a chronic relapsed. He states that he relapsed two years ago and he has been drinking up to sixteen beers daily with his last use being this morning. Patient states that he has a history of DT's and seizures. Patient states that he is prescribed CBD oil in a vape and states that he uses 2000 mg weekly. He states that he uses this to manage his pain issues. Patient states that several years ago that a friend showed up in his life and revealed himself to the patient. He states that the friend is not real, but he sees him and states that he is able to carry on conversations with him.    Principal Problem: MDD (major depressive disorder), recurrent, severe, with psychosis (HCC) Diagnosis:   Patient Active Problem List   Diagnosis Date Noted  . MDD (major depressive disorder), recurrent, severe, with psychosis (HCC) [F33.3] 11/20/2017  . Substance induced mood disorder (HCC) [F19.94]   . Alcohol dependence with uncomplicated withdrawal (HCC) [F10.230] 11/16/2017  . Tachycardia [R00.0] 11/16/2017  . Alcohol intoxication (HCC) [F10.929] 11/16/2017   . Leukocytosis [D72.829] 11/16/2017  . Thrombocytopenia (HCC) [D69.6] 11/16/2017   Total Time spent with patient: 45 minutes  Past Psychiatric History:  See H&P  Past Medical History:  Past Medical History:  Diagnosis Date  . Alcoholism (HCC)    History reviewed. No pertinent surgical history. Family History:  Family History  Family history unknown: Yes   Family Psychiatric  History:  Social History:  Social History   Substance and Sexual Activity  Alcohol Use Yes  . Alcohol/week: 50.4 oz  . Types: 84 Cans of beer per week     Social History   Substance and Sexual Activity  Drug Use Yes  . Types: Marijuana    Social History   Socioeconomic History  . Marital status: Single    Spouse name: Not on file  . Number of children: Not on file  . Years of education: Not on file  . Highest education level: Not on file  Occupational History  . Not on file  Social Needs  . Financial resource strain: Not on file  . Food insecurity:    Worry: Not on file    Inability: Not on file  . Transportation needs:    Medical: Not on file    Non-medical: Not on file  Tobacco Use  . Smoking status: Former Games developer  . Smokeless tobacco: Current User  Substance and Sexual Activity  . Alcohol use: Yes    Alcohol/week: 50.4 oz    Types: 84 Cans of beer per week  . Drug use: Yes    Types: Marijuana  . Sexual activity: Not on file  Lifestyle  .  Physical activity:    Days per week: Not on file    Minutes per session: Not on file  . Stress: Not on file  Relationships  . Social connections:    Talks on phone: Not on file    Gets together: Not on file    Attends religious service: Not on file    Active member of club or organization: Not on file    Attends meetings of clubs or organizations: Not on file    Relationship status: Not on file  Other Topics Concern  . Not on file  Social History Narrative  . Not on file   Additional Social History:         See H&P                 Sleep: Fair  Appetite:  Fair  Current Medications: Current Facility-Administered Medications  Medication Dose Route Frequency Provider Last Rate Last Dose  . acetaminophen (TYLENOL) tablet 650 mg  650 mg Oral Q6H PRN Laveda Abbe, NP      . alum & mag hydroxide-simeth (MAALOX/MYLANTA) 200-200-20 MG/5ML suspension 30 mL  30 mL Oral Q4H PRN Laveda Abbe, NP      . haloperidol (HALDOL) tablet 5 mg  5 mg Oral Q6H PRN Laveda Abbe, NP       And  . benztropine (COGENTIN) tablet 1 mg  1 mg Oral Q6H PRN Laveda Abbe, NP      . dextromethorphan-guaiFENesin [facility] DM) 30-600 MG per 12 hr tablet 1 tablet  1 tablet Oral BID Oneta Rack, NP   1 tablet at 11/25/17 1631  . hydrOXYzine (ATARAX/VISTARIL) tablet 25 mg  25 mg Oral TID PRN Laveda Abbe, NP   25 mg at 11/24/17 2258  . magnesium hydroxide (MILK OF MAGNESIA) suspension 30 mL  30 mL Oral Daily PRN Laveda Abbe, NP      . multivitamin with minerals tablet 1 tablet  1 tablet Oral Daily Nira Conn A, NP   1 tablet at 11/25/17 0817  . PARoxetine (PAXIL) tablet 20 mg  20 mg Oral Daily Mariel Craft, MD   20 mg at 11/25/17 0817  . thiamine (VITAMIN B-1) tablet 100 mg  100 mg Oral Daily Laveda Abbe, NP   100 mg at 11/25/17 0817  . traZODone (DESYREL) tablet 50 mg  50 mg Oral QHS PRN Laveda Abbe, NP   50 mg at 11/24/17 2258    Lab Results:  No results found for this or any previous visit (from the past 48 hour(s)).  Blood Alcohol level:  Lab Results  Component Value Date   ETH 194 (H) 11/21/2017   ETH 245 (H) 11/21/2017    Metabolic Disorder Labs: No results found for: HGBA1C, MPG No results found for: PROLACTIN No results found for: CHOL, TRIG, HDL, CHOLHDL, VLDL, LDLCALC  Physical Findings: AIMS:  , ,  ,  ,    CIWA:  CIWA-Ar Total: 0 COWS:     Musculoskeletal: Strength & Muscle Tone: within normal limits Gait & Station: normal Patient  leans: N/A  Psychiatric Specialty Exam: Physical Exam  Nursing note and vitals reviewed. Constitutional: He appears well-developed.  Psychiatric: He has a normal mood and affect. His behavior is normal.    Review of Systems  Psychiatric/Behavioral: Positive for depression and substance abuse. Negative for hallucinations and suicidal ideas. The patient is nervous/anxious.   All other systems reviewed and are negative.   Blood  pressure (!) 115/95, pulse 84, temperature 98 F (36.7 C), temperature source Oral, resp. rate 18, height 6' (1.829 m), weight 85.7 kg (189 lb), SpO2 98 %.Body mass index is 25.63 kg/m.  General Appearance: Casual  Eye Contact:  Fair  Speech:  Clear and Coherent  Volume:  Normal  Mood:  Anxious and Depressed  Affect:  Congruent  Thought Process:  Coherent  Orientation:  Full (Time, Place, and Person)  Thought Content:  Hallucinations: None  Suicidal Thoughts:  No  Homicidal Thoughts:  No  Memory:  Immediate;   Fair Recent;   Fair Remote;   Fair  Judgement:  Fair  Insight:  Present  Psychomotor Activity:  Normal  Concentration:  Concentration: Fair  Recall:  Fis[employer]Fair  Fund of Knowledge:  Fair  Language:  Fair  Akathisia:  No  Handed:  Right  AIMS (if indicated):     Assets:  Communication Skills Desire for Improvement Resilience Social Support  ADL's:  Intact  Cognition:  WNL  Sleep:  Number of Hours: 3.5     Treatment Plan Summary: Daily contact with patient to assess and evaluate symptoms and progress in treatment and Medication management   Continue with current treatment plan on 11/23/2017 as listed below except were noted  Discontinue one-to-one Continue with Paxil 20mg  for mood stabilization. Continue with Trazodone 100 mg for insomnia  Continue  CWIA/ Librium Protocol Will continue to monitor vitals ,medication compliance and treatment side effects while patient is here.    See chart for agitation protocol  CSW will continue  working on disposition.  Patient to participate in therapeutic milieu  Mariel CraftSHEILA M Farrell Broerman, MD 11/25/2017, 5:49 PM    ..Agree with NP Progress Note

## 2017-11-25 NOTE — Plan of Care (Signed)
  Problem: Education: Goal: Mental status will improve Outcome: Progressing   Problem: Activity: Goal: Interest or engagement in activities will improve Outcome: Progressing   Problem: Safety: Goal: Periods of time without injury will increase Outcome: Progressing  DAR NOTE: Patient presents with anxious affect and mood.  Denies suicidal thoughts, pain, auditory and visual hallucinations.  Rates depression at 0, hopelessness at 0, and anxiety at 0.  Maintained on routine safety checks.  Medications given as prescribed.  Support and encouragement offered as needed.  States goal for today is "discharged."  Patient visible in milieu interacting with staff and peers.  Offered no complaint.

## 2017-11-25 NOTE — Progress Notes (Signed)
Patient ID: Russell HallsMichael Deschepper, male   DOB: [birth year], 47 y.o.   MRN: [id number] D: Patient observed watching TV and interacting well with peers in dayroom. Pt is calm and cooperative. Pt reports possible discharge tomorrow while awaiting bed placement to [facility]. Pt attended evening wrap up group and engaged in discussion. Denies  SI/HI/AVH.No behavioral issues noted.  A: Support and encouragement offered as needed to express needs. Medications administered as prescribed.  R: Patient is safe and cooperative on unit. Will continue to monitor  for safety and stability.

## 2017-11-26 MED ORDER — TRAZODONE HCL 50 MG PO TABS: 50.0000 mg | ORAL_TABLET | Freq: Every evening | ORAL | 0 refills | Status: DC | PRN

## 2017-11-26 MED ORDER — HYDROXYZINE HCL 25 MG PO TABS
25.0000 mg | ORAL_TABLET | Freq: Three times a day (TID) | ORAL | 0 refills | Status: DC | PRN
Start: 2017-11-26 — End: 2019-02-09

## 2017-11-26 MED ORDER — PAROXETINE HCL 20 MG PO TABS: 20.0000 mg | ORAL_TABLET | Freq: Every day | ORAL | 0 refills | Status: DC

## 2017-11-26 NOTE — Progress Notes (Signed)
Recreation Therapy Notes  INPATIENT RECREATION TR PLAN  Patient Details Name: Russell Nichols MRN: [id number] DOB: 11/25/1970 Today's Date: 11/26/2017  Rec Therapy Plan Is patient appropriate for Therapeutic Recreation?: Yes Treatment times per week: about 3 days Estimated Length of Stay: 5-7 days TR Treatment/Interventions: Group participation (Comment)  Discharge Criteria Pt will be discharged from therapy if:: Discharged Treatment plan/goals/alternatives discussed and agreed upon by:: Patient/family  Discharge Summary Short term goals set: See patient care plan Short term goals met: Complete Progress toward goals comments: Groups attended Which groups?: Self-esteem, Anger management, Coping skills Reason goals not met: None Therapeutic equipment acquired: N/A Reason patient discharged from therapy: Discharge from hospital Pt/family agrees with progress & goals achieved: Yes Date patient discharged from therapy: 11/26/17    Victorino Sparrow, LRT/CTRS  Ria Comment, Sabrin Dunlevy A 11/26/2017, 11:53 AM

## 2017-11-26 NOTE — Progress Notes (Signed)
Patient discharged to lobby. Patient was stable and appreciative at that time. All papers, samples and prescriptions were given and valuables returned. Verbal understanding expressed. Denies SI/HI and A/VH. Patient given opportunity to express concerns and ask questions.  

## 2017-11-26 NOTE — Plan of Care (Signed)
  Problem: Education: °Goal: Knowledge of [place] [facility] Education information/materials will improve °Outcome: Completed/Met °Goal: Emotional status will improve °Outcome: Completed/Met °Goal: Mental status will improve °Outcome: Completed/Met °Goal: Verbalization of understanding the information provided will improve °Outcome: Completed/Met °  °Problem: Activity: °Goal: Interest or engagement in activities will improve °Outcome: Completed/Met °Goal: Sleeping patterns will improve °Outcome: Completed/Met °  °Problem: Coping: °Goal: Ability to verbalize frustrations and anger appropriately will improve °Outcome: Completed/Met °Goal: Ability to demonstrate self-control will improve °Outcome: Completed/Met °  °Problem: Health Behavior/Discharge Planning: °Goal: Identification of resources available to assist in meeting health care needs will improve °Outcome: Completed/Met °Goal: Compliance with treatment plan for underlying cause of condition will improve °Outcome: Completed/Met °  °Problem: Physical Regulation: °Goal: Ability to maintain clinical measurements within normal limits will improve °Outcome: Completed/Met °  °Problem: Safety: °Goal: Periods of time without injury will increase °Outcome: Completed/Met °  °

## 2017-11-26 NOTE — Progress Notes (Signed)
  [facility]  Will you be returning to the same living situation after discharge:  No. At discharge, do you have transportation home?: Yes,  father Do you have the ability to pay for your medications: Yes,  mental health  Release of information consent forms completed and in the chart;  Patient's signature needed at discharge.  Patient to Follow up at: Follow-up Information    [name] Follow up.   Why:  [facility] and [name] in admissions confirmed receiving it.  Call them when you get home to find out if you have been accepted, and if so, when you can appear. Contact information: [address] [place] [id number] [phone]           Next level of care provider [facility] Link:no  Safety Planning and Suicide Prevention discussed: Yes,  yes     Has patient been referred to the Quitline?: Patient refused referral  Patient has been referred for addiction treatment: Yes  Russell RogueRodney B Shaunice Levitan, LCSW 11/26/2017, 11:54 AM

## 2017-11-26 NOTE — Tx Team (Signed)
Interdisciplinary Treatment and Diagnostic Plan Update  11/26/2017 Time of Session: 11:58 AM  Russell Nichols MRN: [id number]  Principal Diagnosis: MDD (major depressive disorder), recurrent, severe, with psychosis ([place])  Secondary Diagnoses: Principal Problem:   MDD (major depressive disorder), recurrent, severe, with psychosis ([place])   Current Medications:  Current Facility-Administered Medications  Medication Dose Route Frequency Provider Last Rate Last Dose  . acetaminophen (TYLENOL) tablet 650 mg  650 mg Oral Q6H PRN Ethelene Hal, NP   650 mg at 11/25/17 2229  . alum & mag hydroxide-simeth (MAALOX/MYLANTA) 200-200-20 MG/5ML suspension 30 mL  30 mL Oral Q4H PRN Ethelene Hal, NP      . haloperidol (HALDOL) tablet 5 mg  5 mg Oral Q6H PRN Ethelene Hal, NP       And  . benztropine (COGENTIN) tablet 1 mg  1 mg Oral Q6H PRN Ethelene Hal, NP      . dextromethorphan-guaiFENesin [facility] DM) 30-600 MG per 12 hr tablet 1 tablet  1 tablet Oral BID Derrill Center, NP   1 tablet at 11/26/17 0725  . hydrOXYzine (ATARAX/VISTARIL) tablet 25 mg  25 mg Oral TID PRN Ethelene Hal, NP   25 mg at 11/25/17 2229  . magnesium hydroxide (MILK OF MAGNESIA) suspension 30 mL  30 mL Oral Daily PRN Ethelene Hal, NP      . multivitamin with minerals tablet 1 tablet  1 tablet Oral Daily Lindon Romp A, NP   1 tablet at 11/26/17 0725  . PARoxetine (PAXIL) tablet 20 mg  20 mg Oral Daily Lavella Hammock, MD   20 mg at 11/26/17 0725  . thiamine (VITAMIN B-1) tablet 100 mg  100 mg Oral Daily Ethelene Hal, NP   100 mg at 11/26/17 0725  . traZODone (DESYREL) tablet 50 mg  50 mg Oral QHS PRN Ethelene Hal, NP   50 mg at 11/25/17 2229    PTA Medications: Medications Prior to Admission  Medication Sig Dispense Refill Last Dose  . acetaminophen (TYLENOL) 650 MG CR tablet Take 650 mg by mouth every 8 (eight) hours as needed for pain.   Past Week at  Unknown time  . cetirizine (ZYRTEC) 10 MG tablet Take 10 mg by mouth daily.   Past Week at Unknown time  . escitalopram (LEXAPRO) 20 MG tablet Take 20 mg by mouth daily.  5 Past Week at Unknown time  . QUEtiapine (SEROQUEL) 50 MG tablet Take 25 mg by mouth daily.   Past Week at Unknown time    Patient Stressors:    Patient Strengths:    Treatment Modalities: Medication Management, Group therapy, Case management,  1 to 1 session with clinician, Psychoeducation, Recreational therapy.   Physician Treatment Plan for Primary Diagnosis: MDD (major depressive disorder), recurrent, severe, with psychosis ([place]) Long Term Goal(s): Improvement in symptoms so as ready for discharge  Short Term Goals: Ability to identify changes in lifestyle to reduce recurrence of condition will improve Ability to demonstrate self-control will improve Ability to identify and develop effective coping behaviors will improve Ability to identify triggers associated with substance abuse/mental health issues will improve Ability to identify changes in lifestyle to reduce recurrence of condition will improve Ability to verbalize feelings will improve Ability to disclose and discuss suicidal ideas Ability to identify and develop effective coping behaviors will improve Ability to identify triggers associated with substance abuse/mental health issues will improve  Medication Management: Evaluate patient's response, side effects, and tolerance of medication  regimen.  Therapeutic Interventions: 1 to 1 sessions, Unit Group sessions and Medication administration.  Evaluation of Outcomes: Adequate for Discharge  Physician Treatment Plan for Secondary Diagnosis: Principal Problem:   MDD (major depressive disorder), recurrent, severe, with psychosis ([place])   Long Term Goal(s): Improvement in symptoms so as ready for discharge  Short Term Goals: Ability to identify changes in lifestyle to reduce recurrence of condition will  improve Ability to demonstrate self-control will improve Ability to identify and develop effective coping behaviors will improve Ability to identify triggers associated with substance abuse/mental health issues will improve Ability to identify changes in lifestyle to reduce recurrence of condition will improve Ability to verbalize feelings will improve Ability to disclose and discuss suicidal ideas Ability to identify and develop effective coping behaviors will improve Ability to identify triggers associated with substance abuse/mental health issues will improve  Medication Management: Evaluate patient's response, side effects, and tolerance of medication regimen.  Therapeutic Interventions: 1 to 1 sessions, Unit Group sessions and Medication administration.  Evaluation of Outcomes: Adequate for Discharge   RN Treatment Plan for Primary Diagnosis: MDD (major depressive disorder), recurrent, severe, with psychosis ([place]) Long Term Goal(s): Knowledge of disease and therapeutic regimen to maintain health will improve  Short Term Goals: Ability to disclose and discuss suicidal ideas, Ability to identify and develop effective coping behaviors will improve and Compliance with prescribed medications will improve  Medication Management: RN will administer medications as ordered by provider, will assess and evaluate patient's response and provide education to patient for prescribed medication. RN will report any adverse and/or side effects to prescribing provider.  Therapeutic Interventions: 1 on 1 counseling sessions, Psychoeducation, Medication administration, Evaluate responses to treatment, Monitor vital signs and CBGs as ordered, Perform/monitor CIWA, COWS, AIMS and Fall Risk screenings as ordered, Perform wound care treatments as ordered.  Evaluation of Outcomes: Adequate for Discharge   LCSW Treatment Plan for Primary Diagnosis: MDD (major depressive disorder), recurrent, severe, with  psychosis ([place]) Long Term Goal(s): Safe transition to appropriate next level of care at discharge, Engage patient in therapeutic group addressing interpersonal concerns.  Short Term Goals: Engage patient in aftercare planning with referrals and resources  Therapeutic Interventions: Assess for all discharge needs, 1 to 1 time with Social worker, Explore available resources and support systems, Assess for adequacy in community support network, Educate family and significant other(s) on suicide prevention, Complete Psychosocial Assessment, Interpersonal group therapy.  Evaluation of Outcomes: Met  Father will pick up to gather belongings, and then take pt to stay with friend until he gets into [place].   Progress in Treatment: Attending groups: Yes Participating in groups: Yes Taking medication as prescribed: Yes Toleration medication: Yes, no side effects reported at this time Family/Significant other contact made: No Patient understands diagnosis: Yes AEB asking for help with addiction Discussing patient identified problems/goals with staff: Yes Medical problems stabilized or resolved: Yes Denies suicidal/homicidal ideation: Yes Issues/concerns per patient self-inventory: None Other: N/A  New problem(s) identified: None identified at this time.   New Short Term/Long Term Goal(s): "I've lost everything.  My cousin runs [facility], and she is working to get me in there from here.  If I don't go somewhere, I'm not going to be around anymore.  My anxiety has always been high too."   Discharge Plan or Barriers:   Reason for Continuation of Hospitalization:   Estimated Length of Stay: D/C today  Attendees: Patient:  11/26/2017  11:58 AM  Physician: Melba Coon, MD 11/26/2017  11:58  AM  Nursing: Sena Hitch, RN 11/26/2017  11:58 AM  RN Care Manager: Lars Pinks, RN 11/26/2017  11:58 AM  Social Worker: Ripley Fraise 11/26/2017  11:58 AM  Recreational Therapist: Winfield Cunas 11/26/2017   11:58 AM  Other: Norberto Sorenson 11/26/2017  11:58 AM  Other:  11/26/2017  11:58 AM    Scribe for Treatment Team:  Roque Lias LCSW 11/26/2017 11:58 AM

## 2017-11-26 NOTE — Progress Notes (Signed)
Recreation Therapy Notes  Date: 7.17.19 Time: 0950 Location: [address] Dayroom  Group Topic: Self-Esteem  Goal Area(s) Addresses:  Patient will successfully identify positive attributes about themselves.  Patient will successfully identify benefit of improved self-esteem.   Behavioral Response: Engaged  Intervention: Scientist, clinical (histocompatibility and immunogenetics)Construction paper, scissors, glue sticks, magazines, markers, colored pencils  Activity: Collages. Patients were to create a collage that described and highlighted the positive things about them.  Education: Self-Esteem, Building control surveyorDischarge Planning.   Education Outcome: Acknowledges education/In group clarification offered/Needs additional education  Clinical Observations/Feedback: Pt arrived late to group.  Pt helped his peers locate pictures for their collages.  Pt then made an airplane from orange construction paper.  Pt stated he used orange paper because "orange is optimistic and I'm going to fly out of here".    Caroll RancherMarjette Erick Oxendine, LRT/CTRS     Caroll RancherLindsay, Iara Monds A 11/26/2017 12:22 PM
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
## 2017-11-26 NOTE — [facility] Group Notes (Signed)
Patient did not attend orientation and goals group.  

## 2017-11-26 NOTE — Plan of Care (Signed)
Pt was able to identify coping skills at completion of recreation therapy sessions.   Patterson Hollenbaugh, LRT/CTRS 

## 2017-11-26 NOTE — Discharge Summary (Addendum)
Physician Discharge Summary Note  Patient:  Russell Nichols is an 47 y.o., male  MRN:  [id number]  DOB:  1971-01-06  Patient phone:  [phone] (home)   Patient address:   [place] [address],   Total Time spent with patient: Greater than 30 minutes  Date of Admission:  11/20/2017  Date of Discharge: 11-26-17  Reason for Admission: Worsening psychosis with suicidal ideations.  Principal Problem: MDD (major depressive disorder), recurrent, severe, with psychosis [facility]) Discharge Diagnoses: Patient Active Problem List   Diagnosis Date Noted  . MDD (major depressive disorder), recurrent, severe, with psychosis ([place]) [F33.3] 11/20/2017  . Substance induced mood disorder ([place]) [F19.94]   . Alcohol dependence with uncomplicated withdrawal ([place]) [F10.230] 11/16/2017  . Tachycardia [R00.0] 11/16/2017  . Alcohol intoxication ([place]) [F10.929] 11/16/2017  . Leukocytosis [D72.829] 11/16/2017  . Thrombocytopenia ([place]) [D69.6] 11/16/2017   Past Psychiatric History: MDD  Past Medical History:  Past Medical History:  Diagnosis Date  . Alcoholism ([place])    History reviewed. No pertinent surgical history. Family History:  Family History  Family history unknown: Yes   Family Psychiatric  History: See H&P  Social History:  Social History   Substance and Sexual Activity  Alcohol Use Yes  . Alcohol/week: 50.4 oz  . Types: 84 Cans of beer per week     Social History   Substance and Sexual Activity  Drug Use Yes  . Types: Marijuana    Social History   Socioeconomic History  . Marital status: Single    Spouse name: Not on file  . Number of children: Not on file  . Years of education: Not on file  . Highest education level: Not on file  Occupational History  . Not on file  Social Needs  . Financial resource strain: Not on file  . Food insecurity:    Worry: Not on file    Inability: Not on file  . Transportation needs:    Medical: Not on file     Non-medical: Not on file  Tobacco Use  . Smoking status: Former Research scientist (life sciences)  . Smokeless tobacco: Current User  Substance and Sexual Activity  . Alcohol use: Yes    Alcohol/week: 50.4 oz    Types: 84 Cans of beer per week  . Drug use: Yes    Types: Marijuana  . Sexual activity: Not on file  Lifestyle  . Physical activity:    Days per week: Not on file    Minutes per session: Not on file  . Stress: Not on file  Relationships  . Social connections:    Talks on phone: Not on file    Gets together: Not on file    Attends religious service: Not on file    Active member of club or organization: Not on file    Attends meetings of clubs or organizations: Not on file    Relationship status: Not on file  Other Topics Concern  . Not on file  Social History Narrative  . Not on file   Hospital Course: (Per Md's admission SRA): Russell Nichols a 47 y.o.malewho is a chronic alcoholic with current psychosis and vague suicidal ideation. 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. Patient has experienced up to five years of sobriety at a time, but is a chronicrelapsed. He states that he relapsed two years ago and he has been drinking up to sixteen beers daily with his last use being this  morning. Patient states that he has a history of DT's and seizures. Patient states that he is prescribed CBD oil in a vape and states that he uses 2000 mg weekly. He states that he uses this to manage his pain issues. Patient states that several years ago that a friend showed up in his life and revealed himself to the patient. He states that the friend is not real, but he sees him and states that he is able to carry on conversations with him. He states that he can describe what he looks like and how he is dressed. He states that he does not constantly see this friend, but states that he appears at different points in his life.  Upon his arrival & admision to the [facility]  adult unit, Russell Nichols was evaluated & his presenting symptoms identified. The medication regimen for the presenting symptoms were discussed & initiated targeting those symptoms. He was enrolled in the group counseling sessions & encouraged to participate in the unit programming. He presented no other significant pre-existing medical problems that required treatment other than his mental health issues. He received & was discharged on; Hydroxyzine 20 mg po daily, Paroxetine 20 mg for depression & Trazodone 50 mg prn for insomnia.  As his treatment was ongoing, Russell Nichols was evaluated on daily basis by the clinical providers to assure his response to the treatment regimen.As his treatment progressed,  improvement was noted as evidenced by his report of decreasing symptoms, improved sleep, mood, affect, medication tolerance & active participation in the unit programming.He was encouraged to update his providers on his progress by daily completion of a self inventory assessment, noting mood, mental status, any new symptoms, anxiety and or concerns. Russell Nichols's symptoms responded well to his treatment regimen combined with a therapeutic and supportive environment.   Upon discharge today, Russell Nichols was in much improved condition than upon admission.His symptoms were reported as significantly decreased or resolved completely. He adamantly denies any SI/HI,  AVH, delusional thoughts & or paranoia. He was motivated to continue taking medication with a goal of continued improvement in mental health. He will continue psychiatric care on an outpatient basis as noted below. He was provided with all the necessary information required to make these appointments without problems. He left [facility] with all personal belongings in no apparent distress.   Physical Findings: AIMS:  , ,  ,  ,    CIWA:  CIWA-Ar Total: 0 COWS:     Musculoskeletal: Strength & Muscle Tone: within normal limits Gait & Station: normal Patient leans:  N/A  Psychiatric Specialty Exam: Physical Exam  Nursing note and vitals reviewed. Constitutional: He appears well-developed.  HENT:  Head: Normocephalic.  Eyes: Pupils are equal, round, and reactive to light.  Neck: Normal range of motion.  Respiratory: Effort normal.  GI: Soft.    Review of Systems  Psychiatric/Behavioral: Positive for depression (Stable) and hallucinations (Hx. psychosis). Negative for memory loss, substance abuse and suicidal ideas. The patient has insomnia (Stable). The patient is not nervous/anxious.     Blood pressure (!) 135/101, pulse 67, temperature 98.7 F (37.1 C), temperature source Oral, resp. rate 20, height 6' (1.829 m), weight 85.7 kg (189 lb), SpO2 98 %.Body mass index is 25.63 kg/m.  See Md's SRA   Has this patient used any form of tobacco in the last 30 days? (Cigarettes, Smokeless Tobacco, Cigars, and/or Pipes): N/A  Blood Alcohol level:  Lab Results  Component Value Date   ETH 194 (H) 11/21/2017   ETH  245 (H) 44/81/8563   Metabolic Disorder Labs:  No results found for: HGBA1C, MPG No results found for: PROLACTIN No results found for: CHOL, TRIG, HDL, CHOLHDL, VLDL, LDLCALC  See Psychiatric Specialty Exam and Suicide Risk Assessment completed by Attending Physician prior to discharge.  Discharge destination:  Home  Is patient on multiple antipsychotic therapies at discharge:  No   Has Patient had three or more failed trials of antipsychotic monotherapy by history:  No  Recommended Plan for Multiple Antipsychotic Therapies: NA  Allergies as of 11/26/2017      Reactions   Penicillins Anaphylaxis, Rash   Has patient had a PCN reaction causing immediate rash, facial/tongue/throat swelling, SOB or lightheadedness with hypotension: Y Has patient had a PCN reaction causing severe rash involving mucus membranes or skin necrosis: Y Has patient had a PCN reaction that required hospitalization: N Has patient had a PCN reaction occurring  within the last 10 years: N If all of the above answers are "NO", then may proceed with Cephalosporin use.      Medication List    STOP taking these medications   acetaminophen 650 MG CR tablet Commonly known as:  TYLENOL   cetirizine 10 MG tablet Commonly known as:  ZYRTEC   escitalopram 20 MG tablet Commonly known as:  LEXAPRO   QUEtiapine 50 MG tablet Commonly known as:  SEROQUEL     TAKE these medications     Indication  hydrOXYzine 25 MG tablet Commonly known as:  ATARAX/VISTARIL Take 1 tablet (25 mg total) by mouth 3 (three) times daily as needed for anxiety.  Indication:  Feeling Anxious   PARoxetine 20 MG tablet Commonly known as:  PAXIL Take 1 tablet (20 mg total) by mouth daily. For depression Start taking on:  11/27/2017  Indication:  Major Depressive Disorder   traZODone 50 MG tablet Commonly known as:  DESYREL Take 1 tablet (50 mg total) by mouth at bedtime as needed for sleep.  Indication:  [place] Follow up.   Why:  Your information was sent to [facility] on 7/16, and [name] in admissions confirmed receiving it.  Call them when you get home to find out if you have been accepted, and if so, when you can appear. Contact information: [address] [place] [address] [phone]          Follow-up recommendations: Activity:  As tolerated Diet: As recommended by your primary care doctor. Keep all scheduled follow-up appointments as recommended.   Comments: Patient is instructed prior to discharge to: Take all medications as prescribed by his/her mental healthcare provider. Report any adverse effects and or reactions from the medicines to his/her outpatient provider promptly. Patient has been instructed & cautioned: To not engage in alcohol and or illegal drug use while on prescription medicines. In the event of worsening symptoms, patient is instructed to call the  crisis hotline, 911 and or go to the nearest ED for appropriate evaluation and treatment of symptoms. To follow-up with his/her primary care provider for your other medical issues, concerns and or health care needs.   Signed: Lindell Spar, NP, PMHNP, FNP-BC 11/26/2017, 9:58 AM   I have reviewed NP's Note, assessement, diagnosis and plan, and agree. I have also met with patient and completed suicide risk assessment.  Lavella Hammock, MD

## 2018-07-21 ENCOUNTER — Emergency Department (HOSPITAL_BASED_OUTPATIENT_CLINIC_OR_DEPARTMENT_OTHER): Payer: Self-pay

## 2018-07-21 ENCOUNTER — Emergency Department (HOSPITAL_BASED_OUTPATIENT_CLINIC_OR_DEPARTMENT_OTHER)
Admission: EM | Admit: 2018-07-21 | Discharge: 2018-07-21 | Disposition: A | Discharge status: Home / Self Care | Payer: Self-pay | Attending: Emergency Medicine | Admitting: Emergency Medicine

## 2018-07-21 ENCOUNTER — Other Ambulatory Visit: Payer: Self-pay

## 2018-07-21 ENCOUNTER — Encounter (HOSPITAL_BASED_OUTPATIENT_CLINIC_OR_DEPARTMENT_OTHER): Payer: Self-pay | Admitting: Emergency Medicine

## 2018-07-21 DIAGNOSIS — F1210 Cannabis abuse, uncomplicated: Secondary | ICD-10-CM | POA: Insufficient documentation

## 2018-07-21 DIAGNOSIS — F17228 Nicotine dependence, chewing tobacco, with other nicotine-induced disorders: Secondary | ICD-10-CM | POA: Insufficient documentation

## 2018-07-21 DIAGNOSIS — M2550 Pain in unspecified joint: Secondary | ICD-10-CM | POA: Insufficient documentation

## 2018-07-21 DIAGNOSIS — J189 Pneumonia, unspecified organism: Secondary | ICD-10-CM

## 2018-07-21 DIAGNOSIS — R05 Cough: Secondary | ICD-10-CM | POA: Insufficient documentation

## 2018-07-21 DIAGNOSIS — R509 Fever, unspecified: Secondary | ICD-10-CM | POA: Insufficient documentation

## 2018-07-21 DIAGNOSIS — R42 Dizziness and giddiness: Secondary | ICD-10-CM | POA: Insufficient documentation

## 2018-07-21 DIAGNOSIS — J181 Lobar pneumonia, unspecified organism: Secondary | ICD-10-CM | POA: Insufficient documentation

## 2018-07-21 DIAGNOSIS — R110 Nausea: Secondary | ICD-10-CM | POA: Insufficient documentation

## 2018-07-21 DIAGNOSIS — R638 Other symptoms and signs concerning food and fluid intake: Secondary | ICD-10-CM | POA: Insufficient documentation

## 2018-07-21 LAB — INFLUENZA PANEL BY PCR (TYPE A & B)
Influenza A By PCR: NEGATIVE
Influenza B By PCR: NEGATIVE

## 2018-07-21 MED ORDER — SODIUM CHLORIDE 0.9 % IV BOLUS: 1000.0000 mL | Freq: Once | INTRAVENOUS | Status: DC

## 2018-07-21 MED ORDER — KETOROLAC TROMETHAMINE 30 MG/ML IJ SOLN: 30.0000 mg | Freq: Once | INTRAMUSCULAR | Status: DC

## 2018-07-21 MED ORDER — AZITHROMYCIN 250 MG PO TABS: 250.0000 mg | ORAL_TABLET | Freq: Every day | ORAL | 0 refills | Status: DC

## 2018-07-21 MED FILL — AZITHROMYCIN 250 MG TABLET: 250 | 5 days supply | Qty: 6 | Fill #0

## 2018-07-21 NOTE — ED Provider Notes (Signed)
[facility] EMERGENCY DEPARTMENT Provider Note   CSN: [id number] Arrival date & time: 07/21/18  0849    History   Chief Complaint Chief Complaint  Patient presents with  . Influenza    HPI Russell Nichols is a 48 y.o. male.  He is complaining of 1 week of flulike symptoms.  Diffuse body aches and joint pain.  Cough minimally productive.  Decreased appetite and decreased energy.  He is felt lightheaded and dizzy at times.  For the last few days he has had a fever up to 1019.  He has been trying over-the-counter medications with some improvement.  Did not get a flu shot this year.  No sick contacts or recent travel.     The history is provided by the patient.  Influenza  Presenting symptoms: cough, fatigue, fever, headache, myalgias, nausea and rhinorrhea   Presenting symptoms: no diarrhea, no shortness of breath and no sore throat   Fever:    Duration:  2 days   Timing:  Intermittent   Max temp PTA:  101.9   Progression:  Waxing and waning Severity:  Severe Onset quality:  Gradual Progression:  Worsening Chronicity:  New Relieved by:  Nothing Worsened by:  Eating Ineffective treatments:  OTC medications Associated symptoms: chills, decreased appetite, decreased physical activity and nasal congestion   Associated symptoms: no neck stiffness and no syncope   Risk factors: no diabetes problem, no immunocompromised state and no sick contacts     Past Medical History:  Diagnosis Date  . Alcoholism [facility])     Patient Active Problem List   Diagnosis Date Noted  . MDD (major depressive disorder), recurrent, severe, with psychosis ([facility]) 11/20/2017  . Substance induced mood disorder ([facility])   . Alcohol dependence with uncomplicated withdrawal ([facility]) 11/16/2017  . Tachycardia 11/16/2017  . Alcohol intoxication ([facility]) 11/16/2017  . Leukocytosis 11/16/2017  . Thrombocytopenia ([facility]) 11/16/2017    History reviewed. No pertinent surgical history.      Home  Medications    Prior to Admission medications   Medication Sig Start Date End Date Taking? Authorizing Provider  hydrOXYzine (ATARAX/VISTARIL) 25 MG tablet Take 1 tablet (25 mg total) by mouth 3 (three) times daily as needed for anxiety. 11/26/17   Armandina Stammer I, NP  PARoxetine (PAXIL) 20 MG tablet Take 1 tablet (20 mg total) by mouth daily. For depression 11/27/17   Armandina Stammer I, NP  traZODone (DESYREL) 50 MG tablet Take 1 tablet (50 mg total) by mouth at bedtime as needed for sleep. 11/26/17   Sanjuana Kava, NP    Family History Family History  Family history unknown: Yes    Social History Social History   Tobacco Use  . Smoking status: Former Games developer  . Smokeless tobacco: Current User  Substance Use Topics  . Alcohol use: Yes    Alcohol/week: 84.0 standard drinks    Types: 84 Cans of beer per week  . Drug use: Yes    Types: Marijuana     Allergies   Penicillins   Review of Systems Review of Systems  Constitutional: Positive for chills, decreased appetite, fatigue and fever.  HENT: Positive for congestion and rhinorrhea. Negative for sore throat.   Eyes: Negative for visual disturbance.  Respiratory: Positive for cough. Negative for shortness of breath.   Cardiovascular: Negative for chest pain.  Gastrointestinal: Positive for nausea. Negative for abdominal pain and diarrhea.  Genitourinary: Negative for dysuria.  Musculoskeletal: Positive for arthralgias and myalgias. Negative for neck stiffness.  Skin: Negative for rash.  Neurological: Positive for dizziness and headaches.     Physical Exam Updated Vital Signs BP 123/86   Pulse 76   Ht 6' (1.829 m)   Wt 95.3 kg   SpO2 97%   BMI 28.48 kg/m   Physical Exam Vitals signs and nursing note reviewed.  Constitutional:      Appearance: He is well-developed.  HENT:     Head: Normocephalic and atraumatic.     Right Ear: Tympanic membrane normal.     Left Ear: Tympanic membrane normal.     Nose: Rhinorrhea  present.     Mouth/Throat:     Mouth: Mucous membranes are moist.     Pharynx: Oropharynx is clear. No oropharyngeal exudate or posterior oropharyngeal erythema.  Eyes:     Conjunctiva/sclera: Conjunctivae normal.  Neck:     Musculoskeletal: Normal range of motion and neck supple.  Cardiovascular:     Rate and Rhythm: Normal rate and regular rhythm.     Heart sounds: No murmur.  Pulmonary:     Effort: Pulmonary effort is normal. No respiratory distress.     Breath sounds: Normal breath sounds.  Abdominal:     Palpations: Abdomen is soft.     Tenderness: There is no abdominal tenderness.  Musculoskeletal: Normal range of motion.     Right lower leg: No edema.     Left lower leg: No edema.  Skin:    General: Skin is warm and dry.     Capillary Refill: Capillary refill takes less than 2 seconds.  Neurological:     Mental Status: He is alert and oriented to person, place, and time.     Gait: Gait normal.      ED Treatments / Results  Labs (all labs ordered are listed, but only abnormal results are displayed) Labs Reviewed  INFLUENZA PANEL BY PCR (TYPE A & B)    EKG None  Radiology Dg Chest 2 View  Result Date: 07/21/2018 CLINICAL DATA:  Cough and fever EXAM: CHEST - 2 VIEW COMPARISON:  November 21, 2017 FINDINGS: There is patchy infiltrate in the right upper lobe, felt to represent pneumonia. Lungs elsewhere are clear. Heart size and pulmonary vascularity are normal. No adenopathy. No bone lesions. IMPRESSION: Patchy infiltrate right upper lobe, felt to represent a degree of pneumonia. Lungs elsewhere clear. No evident adenopathy. Electronically Signed   By: Bretta Bang III M.D.   On: 07/21/2018 09:44    Procedures Procedures (including critical care time)  Medications Ordered in ED Medications  sodium chloride 0.9 % bolus 1,000 mL (has no administration in time range)  ketorolac (TORADOL) 30 MG/ML injection 30 mg (has no administration in time range)     Initial  Impression / Assessment and Plan / ED Course  I have reviewed the triage vital signs and the nursing notes.  Pertinent labs & imaging results that were available during my care of the patient were reviewed by me and considered in my medical decision making (see chart for details).  Clinical Course as of Jul 20 1804  Tue Jul 21, 2018  0958 Patient did not want blood work or an IV.  His chest x-ray is positive for pneumonia.  Will cover with Zithromax and clear instructions for return and further testing if he is not improving in a couple of days.   [MB]    Clinical Course User Index [MB] Terrilee Files, MD        Final Clinical Impressions(s) /  ED Diagnoses   Final diagnoses:  Community acquired pneumonia of right upper lobe of lung [facility])    ED Discharge Orders         Ordered    azithromycin (ZITHROMAX) 250 MG tablet  Daily     07/21/18 1000           Terrilee Files, MD 07/21/18 1806

## 2018-07-21 NOTE — Discharge Instructions (Addendum)
You were seen in the emergency department for fevers chills body aches and dry cough.  Your chest x-ray was positive for pneumonia.  We are prescribing some antibiotics that you will need to finish.  Drink plenty of fluids and get rest.  Please return if any worsening symptoms.

## 2018-07-21 NOTE — ED Notes (Signed)
Pt refused labs and IV, EDP aware

## 2018-07-21 NOTE — ED Triage Notes (Signed)
Pt has fever of 102 at home with body aches, URI cough and post nasal drip. Pt has been taking guaifenesin, motrin and sudafed at home and states "he feels like his breathing is worse"

## 2018-07-23 ENCOUNTER — Telehealth (HOSPITAL_COMMUNITY): Payer: Self-pay

## 2018-07-29 ENCOUNTER — Emergency Department (HOSPITAL_BASED_OUTPATIENT_CLINIC_OR_DEPARTMENT_OTHER)
Admission: EM | Admit: 2018-07-29 | Discharge: 2018-07-29 | Disposition: A | Discharge status: Home / Self Care | Payer: Self-pay | Attending: Emergency Medicine | Admitting: Emergency Medicine

## 2018-07-29 ENCOUNTER — Other Ambulatory Visit: Payer: Self-pay

## 2018-07-29 ENCOUNTER — Emergency Department (HOSPITAL_BASED_OUTPATIENT_CLINIC_OR_DEPARTMENT_OTHER): Payer: Self-pay

## 2018-07-29 DIAGNOSIS — R0602 Shortness of breath: Secondary | ICD-10-CM | POA: Insufficient documentation

## 2018-07-29 DIAGNOSIS — J181 Lobar pneumonia, unspecified organism: Secondary | ICD-10-CM | POA: Insufficient documentation

## 2018-07-29 DIAGNOSIS — J189 Pneumonia, unspecified organism: Secondary | ICD-10-CM

## 2018-07-29 DIAGNOSIS — F17228 Nicotine dependence, chewing tobacco, with other nicotine-induced disorders: Secondary | ICD-10-CM | POA: Insufficient documentation

## 2018-07-29 DIAGNOSIS — M791 Myalgia, unspecified site: Secondary | ICD-10-CM | POA: Insufficient documentation

## 2018-07-29 DIAGNOSIS — R05 Cough: Secondary | ICD-10-CM | POA: Insufficient documentation

## 2018-07-29 DIAGNOSIS — F1210 Cannabis abuse, uncomplicated: Secondary | ICD-10-CM | POA: Insufficient documentation

## 2018-07-29 MED ORDER — DOXYCYCLINE HYCLATE 100 MG PO CAPS: 100.0000 mg | ORAL_CAPSULE | Freq: Two times a day (BID) | ORAL | 0 refills | Status: AC

## 2018-07-29 MED FILL — DOXYCYCLINE HYCLATE 100 MG: 100 | 7 days supply | Qty: 14 | Fill #0

## 2018-07-29 NOTE — Discharge Instructions (Addendum)
Take antibiotics as prescribed.  Take entire course, even if your symptoms improve. Use Tylenol or ibuprofen as needed for fever or body aches. Make sure you are staying well-hydrated water. Return to the emergency room with any new, worsening, concerning symptoms.

## 2018-07-29 NOTE — ED Provider Notes (Signed)
[facility] EMERGENCY DEPARTMENT Provider Note   CSN: [id number] Arrival date & time: 07/29/18  9169    History   Chief Complaint Chief Complaint  Patient presents with  . Cough    persistent    HPI Russell Nichols is a 48 y.o. male presenting for evaluation of intermittent fever.   Pt states he was diagnosed with pna 10 days ago.  He was started on a Z-Pak, has been taking guanfacine for cough.  Patient states cough is improving, but he is still having intermittent fevers.  Patient states he has a fever from 2 PM to midnight, T-max 101.9.  He has not taking anything for his fever including Tylenol or ibuprofen.  When he has fevers, he also has body aches and feels horrible.  Symptoms resolve on their own.  Patient reports he is still vaping, has stopped using dip.  He denies sick contacts.  He denies recent travel.  He has finished his course of antibiotics.  He denies history of asthma or COPD. Pt Vapes, but stopped using dip recently.  He states he has no other medical problems, takes no medications daily.  He denies nasal congestion, sore throat, ear pain, chest pain, nausea, vomiting, abdominal pain, urinary symptoms, abnormal bowel movements.  Patient states when he has a fever and is exerting himself, he feels short of breath.  No shortness of breath currently. No reported contact with COVID-19 positive persons or recent travel to high-risk areas.   Additional history obtained from chart review.  Last visit reviewed, x-ray reviewed.  Patient was flu negative at that time.     HPI  Past Medical History:  Diagnosis Date  . Alcoholism [facility])     Patient Active Problem List   Diagnosis Date Noted  . MDD (major depressive disorder), recurrent, severe, with psychosis (HCC) 11/20/2017  . Substance induced mood disorder (HCC)   . Alcohol dependence with uncomplicated withdrawal (HCC) 11/16/2017  . Tachycardia 11/16/2017  . Alcohol intoxication (HCC) 11/16/2017  .  Leukocytosis 11/16/2017  . Thrombocytopenia (HCC) 11/16/2017    No past surgical history on file.      Home Medications    Prior to Admission medications   Medication Sig Start Date End Date Taking? Authorizing Provider  azithromycin (ZITHROMAX) 250 MG tablet Take 1 tablet (250 mg total) by mouth daily. Take first 2 tablets together, then 1 every day until finished. 07/21/18   Terrilee Files, MD  doxycycline (VIBRAMYCIN) 100 MG capsule Take 1 capsule (100 mg total) by mouth 2 (two) times daily for 7 days. 07/29/18 08/05/18  Carnel Stegman, PA-C  hydrOXYzine (ATARAX/VISTARIL) 25 MG tablet Take 1 tablet (25 mg total) by mouth 3 (three) times daily as needed for anxiety. 11/26/17   Armandina Stammer I, NP  PARoxetine (PAXIL) 20 MG tablet Take 1 tablet (20 mg total) by mouth daily. For depression 11/27/17   Armandina Stammer I, NP  traZODone (DESYREL) 50 MG tablet Take 1 tablet (50 mg total) by mouth at bedtime as needed for sleep. 11/26/17   Sanjuana Kava, NP    Family History Family History  Family history unknown: Yes    Social History Social History   Tobacco Use  . Smoking status: Former Games developer  . Smokeless tobacco: Current User  Substance Use Topics  . Alcohol use: Yes    Alcohol/week: 84.0 standard drinks    Types: 84 Cans of beer per week  . Drug use: Yes    Types: Marijuana  Allergies   Penicillins   Review of Systems Review of Systems  Constitutional: Positive for fever (intermittent).  Respiratory: Positive for cough and shortness of breath (with fever).   Musculoskeletal: Positive for myalgias (with fever).  All other systems reviewed and are negative.    Physical Exam Updated Vital Signs BP 110/65   Pulse 74   Temp 97.7 F (36.5 C) (Oral)   Resp 18   Ht 6' (1.829 m)   Wt 97.5 kg   SpO2 97%   BMI 29.16 kg/m   Physical Exam Vitals signs and nursing note reviewed.  Constitutional:      General: He is not in acute distress.    Appearance: He is  well-developed.     Comments: Appears nontoxic  HENT:     Head: Normocephalic and atraumatic.     Comments: OP clear without tonsillar swelling or exudate.  Uvula midline with equal palate rise.  TMs nonerythematous nonbulging bilaterally. Eyes:     Conjunctiva/sclera: Conjunctivae normal.     Pupils: Pupils are equal, round, and reactive to light.  Neck:     Musculoskeletal: Normal range of motion and neck supple.  Cardiovascular:     Rate and Rhythm: Normal rate and regular rhythm.     Pulses: Normal pulses.  Pulmonary:     Effort: Pulmonary effort is normal. No respiratory distress.     Breath sounds: Normal breath sounds. No wheezing.     Comments: Speaking in full sentences.  Clear lung sounds in all fields.  No signs of respiratory distress or shortness of breath. Abdominal:     General: There is no distension.     Palpations: Abdomen is soft. There is no mass.     Tenderness: There is no abdominal tenderness. There is no guarding or rebound.  Musculoskeletal: Normal range of motion.  Skin:    General: Skin is warm and dry.     Capillary Refill: Capillary refill takes less than 2 seconds.  Neurological:     Mental Status: He is alert and oriented to person, place, and time.      ED Treatments / Results  Labs (all labs ordered are listed, but only abnormal results are displayed) Labs Reviewed - No data to display  EKG None  Radiology Dg Chest 2 View  Result Date: 07/29/2018 CLINICAL DATA:  Dx w/pna 2wks ago. Still has cough, fever, chills, sweats, body aches. EXAM: CHEST - 2 VIEW COMPARISON:  none FINDINGS: Near complete resolution of the right upper lobe infiltrate. Lungs otherwise clear. Heart size and mediastinal contours are within normal limits. No effusion. Visualized bones unremarkable. IMPRESSION: Resolving right upper lobe infiltrate.  No acute findings. Electronically Signed   By: Corlis Leak M.D.   On: 07/29/2018 10:53    Procedures Procedures (including  critical care time)  Medications Ordered in ED Medications - No data to display   Initial Impression / Assessment and Plan / ED Course  I have reviewed the triage vital signs and the nursing notes.  Pertinent labs & imaging results that were available during my care of the patient were reviewed by me and considered in my medical decision making (see chart for details).        Patient presenting for evaluation of cough and intermittent fever.  Physical exam shows patient is afebrile not tachycardic.  Appears nontoxic.  Pulmonary exam reassuring, no signs of respiratory distress.  Lung sounds clear.  Patient with negative coated 19 screening at this time, and as  symptoms have been present for 1.5 to 2 weeks, low suspicion for coronavirus at this time.  As patient reports continued fevers, will obtain repeat chest x-ray for further evaluation.  X-ray viewed interpreted by me, shows improving pneumonia, but not complete resolution.  As patient was treated with a Z-Pak previously, consider only partial treatment of the pneumonia/resistance.  Will give doxycycline for further treatment.  Encouraged Tylenol and ibuprofen use as needed for fevers. Encouraged pt to stop vaping. At this time, patient appears safe for discharge.  Return precautions given.  Patient states he understands and agrees plan.   Final Clinical Impressions(s) / ED Diagnoses   Final diagnoses:  Community acquired pneumonia of right upper lobe of lung [facility])    ED Discharge Orders         Ordered    doxycycline (VIBRAMYCIN) 100 MG capsule  2 times daily     07/29/18 1121           Bhumi Godbey, PA-C 07/29/18 1125    Alvira Monday, MD 07/29/18 2146

## 2018-07-29 NOTE — ED Triage Notes (Signed)
Pt reports persistent cough, Dx pneumonia 2 weeks ago. denies chest pain , yet reports shortness of breath with ambulation.

## 2018-11-12 ENCOUNTER — Ambulatory Visit: Payer: Self-pay | Admitting: Family Medicine

## 2018-12-11 ENCOUNTER — Ambulatory Visit: Payer: Self-pay | Admitting: Family Medicine

## 2019-01-13 ENCOUNTER — Ambulatory Visit: Payer: Self-pay | Admitting: Family Medicine

## 2019-02-08 ENCOUNTER — Other Ambulatory Visit: Payer: Self-pay

## 2019-02-08 ENCOUNTER — Encounter (HOSPITAL_BASED_OUTPATIENT_CLINIC_OR_DEPARTMENT_OTHER): Payer: Self-pay

## 2019-02-08 ENCOUNTER — Emergency Department (HOSPITAL_BASED_OUTPATIENT_CLINIC_OR_DEPARTMENT_OTHER)
Admission: EM | Admit: 2019-02-08 | Discharge: 2019-02-10 | Disposition: A | Discharge status: Other Acute Inpatient Hospital | Payer: Self-pay | Attending: Family Medicine | Admitting: Family Medicine

## 2019-02-08 DIAGNOSIS — F1010 Alcohol abuse, uncomplicated: Secondary | ICD-10-CM

## 2019-02-08 DIAGNOSIS — F1024 Alcohol dependence with alcohol-induced mood disorder: Secondary | ICD-10-CM | POA: Diagnosis present

## 2019-02-08 DIAGNOSIS — F329 Major depressive disorder, single episode, unspecified: Secondary | ICD-10-CM | POA: Insufficient documentation

## 2019-02-08 DIAGNOSIS — R45851 Suicidal ideations: Secondary | ICD-10-CM | POA: Insufficient documentation

## 2019-02-08 DIAGNOSIS — Z20828 Contact with and (suspected) exposure to other viral communicable diseases: Secondary | ICD-10-CM | POA: Insufficient documentation

## 2019-02-08 DIAGNOSIS — F10230 Alcohol dependence with withdrawal, uncomplicated: Secondary | ICD-10-CM | POA: Insufficient documentation

## 2019-02-08 DIAGNOSIS — F17200 Nicotine dependence, unspecified, uncomplicated: Secondary | ICD-10-CM | POA: Insufficient documentation

## 2019-02-08 LAB — CBC WITH DIFFERENTIAL/PLATELET
Abs Immature Granulocytes: 0.03 10*3/uL (ref 0.00–0.07)
Basophils Absolute: 0.1 10*3/uL (ref 0.0–0.1)
Basophils Relative: 1 %
Eosinophils Absolute: 0.2 10*3/uL (ref 0.0–0.5)
Eosinophils Relative: 2 %
HCT: 47.3 % (ref 39.0–52.0)
Hemoglobin: 16 g/dL (ref 13.0–17.0)
Immature Granulocytes: 0 %
Lymphocytes Relative: 41 %
Lymphs Abs: 3.2 10*3/uL (ref 0.7–4.0)
MCH: 30.2 pg (ref 26.0–34.0)
MCHC: 33.8 g/dL (ref 30.0–36.0)
MCV: 89.2 fL (ref 80.0–100.0)
Monocytes Absolute: 0.6 10*3/uL (ref 0.1–1.0)
Monocytes Relative: 8 %
Neutro Abs: 3.6 10*3/uL (ref 1.7–7.7)
Neutrophils Relative %: 48 %
Platelets: 382 10*3/uL (ref 150–400)
RBC: 5.3 MIL/uL (ref 4.22–5.81)
RDW: 13.3 % (ref 11.5–15.5)
WBC: 7.7 10*3/uL (ref 4.0–10.5)
nRBC: 0 % (ref 0.0–0.2)

## 2019-02-08 LAB — COMPREHENSIVE METABOLIC PANEL
ALT: 28 U/L (ref 0–44)
AST: 26 U/L (ref 15–41)
Albumin: 4.8 g/dL (ref 3.5–5.0)
Alkaline Phosphatase: 62 U/L (ref 38–126)
Anion gap: 13 (ref 5–15)
BUN: 12 mg/dL (ref 6–20)
CO2: 20 mmol/L — ABNORMAL LOW (ref 22–32)
Calcium: 9.6 mg/dL (ref 8.9–10.3)
Chloride: 107 mmol/L (ref 98–111)
Creatinine, Ser: 0.91 mg/dL (ref 0.61–1.24)
GFR calc Af Amer: >60 mL/min (ref >60)
GFR calc non Af Amer: >60 mL/min (ref >60)
Glucose, Bld: 110 mg/dL — ABNORMAL HIGH (ref 70–99)
Potassium: 4 mmol/L (ref 3.5–5.1)
Sodium: 140 mmol/L (ref 135–145)
Total Bilirubin: 0.6 mg/dL (ref 0.3–1.2)
Total Protein: 7.9 g/dL (ref 6.5–8.1)

## 2019-02-08 LAB — URINALYSIS, ROUTINE W REFLEX MICROSCOPIC
Bilirubin Urine: NEGATIVE
Glucose, UA: NEGATIVE mg/dL
Hgb urine dipstick: NEGATIVE
Ketones, ur: NEGATIVE mg/dL
Leukocytes,Ua: NEGATIVE
Nitrite: NEGATIVE
Protein, ur: NEGATIVE mg/dL
Specific Gravity, Urine: <1.005 — ABNORMAL LOW (ref 1.005–1.030)
pH: 6 (ref 5.0–8.0)

## 2019-02-08 LAB — ACETAMINOPHEN LEVEL: Acetaminophen (Tylenol), Serum: <10 ug/mL — ABNORMAL LOW (ref 10–30)

## 2019-02-08 LAB — RAPID URINE DRUG SCREEN, HOSP PERFORMED
Amphetamines: NOT DETECTED
Barbiturates: NOT DETECTED
Benzodiazepines: NOT DETECTED
Cocaine: NOT DETECTED
Opiates: NOT DETECTED
Tetrahydrocannabinol: NOT DETECTED

## 2019-02-08 LAB — SALICYLATE LEVEL: Salicylate Lvl: <7 mg/dL (ref 2.8–30.0)

## 2019-02-08 LAB — ETHANOL: Alcohol, Ethyl (B): 171 mg/dL — ABNORMAL HIGH (ref <10)

## 2019-02-08 MED ORDER — LORAZEPAM 2 MG/ML IJ SOLN: 0.0000 mg | Freq: Two times a day (BID) | INTRAMUSCULAR | Status: DC

## 2019-02-08 MED ORDER — LORAZEPAM 2 MG/ML IJ SOLN
INTRAMUSCULAR | Status: AC
  Administered 2019-02-08: 2 mg via INTRAMUSCULAR
  Filled 2019-02-08: qty 1

## 2019-02-08 MED ORDER — LORAZEPAM 2 MG/ML IJ SOLN: 0.0000 mg | Freq: Four times a day (QID) | INTRAMUSCULAR | Status: DC

## 2019-02-08 MED ORDER — DIPHENHYDRAMINE HCL 50 MG/ML IJ SOLN
INTRAMUSCULAR | Status: AC
  Administered 2019-02-08: 50 mg via INTRAMUSCULAR
  Filled 2019-02-08: qty 1

## 2019-02-08 MED ORDER — VITAMIN B-1 100 MG PO TABS
100.0000 mg | ORAL_TABLET | Freq: Every day | ORAL | Status: DC
  Administered 2019-02-09 – 2019-02-10 (×2): 100 mg via ORAL
  Filled 2019-02-08 (×2): qty 1

## 2019-02-08 MED ORDER — ZIPRASIDONE MESYLATE 20 MG IM SOLR
20.0000 mg | Freq: Once | INTRAMUSCULAR | Status: AC
  Administered 2019-02-08: 15:00:00 20 mg via INTRAMUSCULAR

## 2019-02-08 MED ORDER — DIPHENHYDRAMINE HCL 50 MG/ML IJ SOLN
50.0000 mg | Freq: Once | INTRAMUSCULAR | Status: AC
  Administered 2019-02-08: 15:00:00 50 mg via INTRAMUSCULAR

## 2019-02-08 MED ORDER — THIAMINE HCL 100 MG/ML IJ SOLN: 100.0000 mg | Freq: Every day | INTRAMUSCULAR | Status: DC

## 2019-02-08 MED ORDER — ZIPRASIDONE MESYLATE 20 MG IM SOLR
INTRAMUSCULAR | Status: AC
  Administered 2019-02-08: 20 mg via INTRAMUSCULAR
  Filled 2019-02-08: qty 20

## 2019-02-08 MED ORDER — LORAZEPAM 1 MG PO TABS
0.0000 mg | ORAL_TABLET | Freq: Four times a day (QID) | ORAL | Status: DC
  Administered 2019-02-09: 3 mg via ORAL
  Administered 2019-02-09 (×2): 1 mg via ORAL
  Administered 2019-02-10: 3 mg via ORAL
  Filled 2019-02-08 (×2): qty 1
  Filled 2019-02-08 (×2): qty 3

## 2019-02-08 MED ORDER — LORAZEPAM 2 MG/ML IJ SOLN
2.0000 mg | Freq: Once | INTRAMUSCULAR | Status: AC
  Administered 2019-02-08: 15:00:00 2 mg via INTRAMUSCULAR

## 2019-02-08 MED ORDER — LORAZEPAM 1 MG PO TABS: 0.0000 mg | ORAL_TABLET | Freq: Two times a day (BID) | ORAL | Status: DC
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
## 2019-02-08 NOTE — ED Triage Notes (Signed)
Pt here for SI-recent admn SI-pt NAD-steady gait

## 2019-02-08 NOTE — ED Notes (Signed)
Pt sleeping; unable to complete Psychiatric Assessment at this time.

## 2019-02-08 NOTE — ED Notes (Signed)
Pt reluctantly cooperative, declines urine sample, lab draw, ekg, cardiac monitoring, and refuses to give up his cell phone or ear buds.

## 2019-02-08 NOTE — BH Assessment (Signed)
Disposition: Ricky Ala, NP recommends in patient treatment.

## 2019-02-08 NOTE — ED Provider Notes (Signed)
Emergency Department Provider Note   I have reviewed the triage vital signs and the nursing notes.   HISTORY  Chief Complaint Suicidal   HPI Russell Nichols is a 48 y.o. male with PMH of EtOH, MDD, and substance induced mood disorder presents to the emergency department for evaluation of suicidal ideation.  Patient states he has PTSD and alcohol abuse.  He states that he "feeling really anxious."  When asked about feeling suicidal he states "you know what I'm going to say, or I could lie."  Patient initially checked into the emergency department but then quickly left through the ambulance bay doors and my interview was performed there because he was unwilling to return to the emergency department.  Level 5 caveat: EtOH intoxication.    Past Medical History:  Diagnosis Date  . Alcoholism [facility])     Patient Active Problem List   Diagnosis Date Noted  . MDD (major depressive disorder), recurrent, severe, with psychosis ([place]) 11/20/2017  . Substance induced mood disorder ([place])   . Alcohol dependence with uncomplicated withdrawal ([place]) 11/16/2017  . Tachycardia 11/16/2017  . Alcohol intoxication ([place]) 11/16/2017  . Leukocytosis 11/16/2017  . Thrombocytopenia ([place]) 11/16/2017    History reviewed. No pertinent surgical history.  Allergies Penicillins  Family History  Family history unknown: Yes    Social History Social History   Tobacco Use  . Smoking status: Current Every Day Smoker    Types: Cigars  . Smokeless tobacco: Former Network engineer Use Topics  . Alcohol use: Yes    Comment: last drank just PTA  . Drug use: Yes    Types: Marijuana    Comment: last used 01/23/19    Review of Systems  Constitutional: No fever/chills Eyes: No visual changes. ENT: No sore throat. Cardiovascular: Denies chest pain. Respiratory: Denies shortness of breath. Gastrointestinal: No abdominal pain.  No nausea, no vomiting.  No diarrhea.  No constipation. Genitourinary:  Negative for dysuria. Musculoskeletal: Negative for back pain. Skin: Negative for rash. Neurological: Negative for headaches, focal weakness or numbness. Psychiatric: Positive SI and EtOH intoxication.   10-point ROS otherwise negative.  ____________________________________________   PHYSICAL EXAM:  VITAL SIGNS: ED Triage Vitals  Enc Vitals Group     BP 02/08/19 1428 (!) 128/102     Pulse Rate 02/08/19 1428 (!) 108     Resp 02/08/19 1428 20     Temp 02/08/19 1428 98.9 F (37.2 C)     Temp Source 02/08/19 1428 Oral     SpO2 02/08/19 1428 98 %     Weight 02/08/19 1428 190 lb (86.2 kg)     Height 02/08/19 1428 6' (1.829 m)   Constitutional: Patient appears agitated and tearful at times.  Eyes: Conjunctivae are normal. Head: Atraumatic. Nose: No congestion/rhinnorhea. Mouth/Throat: Mucous membranes are moist.  Neck: No stridor.  Cardiovascular: Tachycardia.  Respiratory: Normal respiratory effort.  Musculoskeletal: No gross deformities of extremities. Neurologic:  No gross focal neurologic deficits are appreciated.  Psychiatric: Mood and affect are agitated. Speech and behavior are normal.  ____________________________________________   LABS (all labs ordered are listed, but only abnormal results are displayed)  Labs Reviewed  COMPREHENSIVE METABOLIC PANEL - Abnormal; Notable for the following components:      Result Value   CO2 20 (*)    Glucose, Bld 110 (*)    All other components within normal limits  ETHANOL - Abnormal; Notable for the following components:   Alcohol, Ethyl (B) 171 (*)    All  other components within normal limits  URINALYSIS, ROUTINE W REFLEX MICROSCOPIC - Abnormal; Notable for the following components:   Specific Gravity, Urine <1.005 (*)    All other components within normal limits  ACETAMINOPHEN LEVEL - Abnormal; Notable for the following components:   Acetaminophen (Tylenol), Serum <10 (*)    All other components within normal limits   SARS CORONAVIRUS 2 (HOSPITAL ORDER, PERFORMED IN [facility])  RAPID URINE DRUG SCREEN, HOSP PERFORMED  CBC WITH DIFFERENTIAL/PLATELET  SALICYLATE LEVEL   ____________________________________________   PROCEDURES  Procedure(s) performed:   Procedures  CRITICAL CARE Performed by: Maia PlanJoshua G Long Total critical care time: 35 minutes Critical care time was exclusive of separately billable procedures and treating other patients. Critical care was necessary to treat or prevent imminent or life-threatening deterioration. Critical care was time spent personally by me on the following activities: development of treatment plan with patient and/or surrogate as well as nursing, discussions with consultants, evaluation of patient's response to treatment, examination of patient, obtaining history from patient or surrogate, ordering and performing treatments and interventions, ordering and review of laboratory studies, ordering and review of radiographic studies, pulse oximetry and re-evaluation of patient's condition.  Alona BeneJoshua Long, MD Emergency Medicine  ____________________________________________   INITIAL IMPRESSION / ASSESSMENT AND PLAN / ED COURSE  Pertinent labs & imaging results that were available during my care of the patient were reviewed by me and considered in my medical decision making (see chart for details).   Patient presents to the emergency department for evaluation of suicidal ideation.  He checked then, came back to the acute care room immediately, and then subsequently left the emergency department through the ambulance bay doors.  He was stopped there by Engineer, materialssecurity officer who engaged him in conversation.  I came out to evaluate.  The patient tells me that he is feeling suicidal but will not tell me any specific plan.  He pleads with me to let him make a phone call and then he will return to the emergency department.  I offered to let him make a phone call in private  inside the department but he refuses.  He tells me that "I am going to walk away" he states "I know what you have to do."  I informed him that because of his stated suicidal ideation I cannot allow him to leave and I am concerned about his safety.  I have completed IVC paperwork and follow them with the magistrate.  I have called security as well as local police to assist with getting the patient back to the department so he can be treated for his acute psychiatric illness.   Patient was able to come in with police after further discussion in the parking lot.  His lab work is pending.  He was given medication to help calm him down as he was still somewhat agitated here in the emergency department.  Care transferred to Dr. Pilar PlateBero pending labs.  ____________________________________________  FINAL CLINICAL IMPRESSION(S) / ED DIAGNOSES  Final diagnoses:  Suicidal ideation  Alcohol abuse     MEDICATIONS GIVEN DURING THIS VISIT:  Medications  LORazepam (ATIVAN) injection 0-4 mg (0 mg Intravenous Not Given 02/09/19 0212)    Or  LORazepam (ATIVAN) tablet 0-4 mg ( Oral See Alternative 02/09/19 0212)  LORazepam (ATIVAN) injection 0-4 mg (has no administration in time range)    Or  LORazepam (ATIVAN) tablet 0-4 mg (has no administration in time range)  thiamine (VITAMIN B-1) tablet 100 mg (has  no administration in time range)    Or  thiamine (B-1) injection 100 mg (has no administration in time range)  escitalopram (LEXAPRO) tablet 20 mg (has no administration in time range)  QUEtiapine (SEROQUEL) tablet 75 mg (has no administration in time range)  ziprasidone (GEODON) injection 20 mg (20 mg Intramuscular Given 02/08/19 1516)  diphenhydrAMINE (BENADRYL) injection 50 mg (50 mg Intramuscular Given 02/08/19 1516)  LORazepam (ATIVAN) injection 2 mg (2 mg Intramuscular Given 02/08/19 1516)    Note:  This document was prepared using Dragon voice recognition software and may include unintentional dictation  errors.  Alona Bene, MD, [facility] Emergency Medicine    Long, Arlyss Repress, MD 02/09/19 [phone]

## 2019-02-08 NOTE — ED Notes (Signed)
TTS consult complete; pt room door opened

## 2019-02-08 NOTE — ED Provider Notes (Signed)
  Provider Note MRN:  [id number]  Arrival date & time: 02/08/19    ED Course and Medical Decision Making  Assumed care from Dr. Laverta Baltimore at shift change.  History of substance-induced mood disorder here with depression and suicidal intent.  TTS consulted and they recommend inpatient management.  Patient was sedated at the beginning of my shift, PRN's ordered specifically for possible withdrawal as patient is a heavy drinker.  Awaiting placement.  Signed out to default provider.  Critical Care Documentation Critical care time provided by me (excluding procedures): 31 minutes  Condition necessitating critical care: Substance-induced mood disorder, suicidal ideation  Components of critical care management: reviewing of prior records, laboratory and imaging interpretation, frequent re-examination and reassessment of vital signs, administration of IV benzodiazepines, discussion with consulting services.    Final Clinical Impressions(s) / ED Diagnoses     ICD-10-CM   1. Suicidal ideation  R45.851   2. Alcohol abuse  F10.10     ED Discharge Orders    None      Discharge Instructions   None     Millan Legan. [place] Small, [place] [email or web address]    Maudie Flakes, MD 02/08/19 2102

## 2019-02-08 NOTE — ED Notes (Signed)
Pt trembling, legs shaking, very anxious, states he wants to harm himself. [employer] at bedside, pt accepts medication injections. Pt refuses to relinquish cell phone and wireless ear buds for now, all other belongings placed in bags and stored in locked cabinet. Jeneen Rinks, security at bedside to wand pt.

## 2019-02-09 LAB — SARS CORONAVIRUS 2 BY RT PCR (HOSPITAL ORDER, PERFORMED IN ~~LOC~~ HOSPITAL LAB): SARS Coronavirus 2: NEGATIVE

## 2019-02-09 MED ORDER — ESCITALOPRAM OXALATE 20 MG PO TABS
20.0000 mg | ORAL_TABLET | Freq: Every day | ORAL | Status: DC
  Administered 2019-02-09 – 2019-02-10 (×2): 20 mg via ORAL
  Filled 2019-02-09: qty 1

## 2019-02-09 MED ORDER — LORAZEPAM 2 MG/ML IJ SOLN
INTRAMUSCULAR | Status: AC
  Administered 2019-02-09: 2 mg via INTRAMUSCULAR
  Filled 2019-02-09: qty 1

## 2019-02-09 MED ORDER — ZIPRASIDONE MESYLATE 20 MG IM SOLR
INTRAMUSCULAR | Status: AC
  Administered 2019-02-09: 20 mg via INTRAMUSCULAR
  Filled 2019-02-09: qty 20

## 2019-02-09 MED ORDER — LORAZEPAM 2 MG/ML IJ SOLN
2.0000 mg | Freq: Once | INTRAMUSCULAR | Status: AC
  Administered 2019-02-09: 19:00:00 2 mg via INTRAMUSCULAR

## 2019-02-09 MED ORDER — ZIPRASIDONE MESYLATE 20 MG IM SOLR
20.0000 mg | Freq: Once | INTRAMUSCULAR | Status: AC
  Administered 2019-02-09: 19:00:00 20 mg via INTRAMUSCULAR

## 2019-02-09 MED ORDER — QUETIAPINE FUMARATE 25 MG PO TABS
75.0000 mg | ORAL_TABLET | Freq: Every day | ORAL | Status: DC
  Administered 2019-02-09: 75 mg via ORAL
  Filled 2019-02-09: qty 3

## 2019-02-09 NOTE — Consult Note (Signed)
Telepsych Consultation   Reason for Consult:  SI Referring Physician:  EDP Location of Patient: [facility] Location of Provider: Behavioral Health TTS Department  Patient Identification: Russell Nichols MRN:  [id number] Principal Diagnosis: Alcohol dependence with uncomplicated withdrawal (HCC) Diagnosis:  Principal Problem:   Alcohol dependence with uncomplicated withdrawal (HCC)   Total Time spent with patient: 30 minutes  Subjective:   Russell Nichols is a 48 y.o. male patient admitted with suicidal ideations. Patient tearful during assessment today, alert and oriented, answers appropriately. Patient complains of passive suicidal ideations, I don't want to live, I don't want to wake up." Patient states, "I'm so tired, I'm in emotional pain all the time."  Patient denies plan, denies intent. Patient denies homicidal ideations. Patient denies auditory and visual hallucinations. Patient endorses history of alcohol use disorder, "I have been to lots of places for treatment." Most recent substance treatment 2019 at [facility], sober 9 months.  Patient stressors include being evicted from home after causing death of homeowners dog and recently ordered to begin probation for worthless checks on Wednesday of this week. Patient now homeless, currently not employed.   HPI: Per tts assessment: Patient is cooperative during this clinician's assessment, however appears highly anxious. Patient states "I'm just tired and I don't want to be alive anymore. Things would be better if I weren't here." He denies a specific plan but states he feels he would do something to harm himself.  Past Psychiatric History: Alcohol use disorder, MDD, PTSD  Risk to Self: Suicidal Ideation: Yes-Currently Present Suicidal Intent: No-Not Currently/Within Last 6 Months Is patient at risk for suicide?: Yes Suicidal Plan?: No-Not Currently/Within Last 6 Months Access to Means: Yes Specify Access to Suicidal Means:  access to alcohol What has been your use of drugs/alcohol within the last 12 months?: daily alcohol use How many times?: 1 Other Self Harm Risks: none noted Triggers for Past Attempts: Unknown Intentional Self Injurious Behavior: None Risk to Others: Homicidal Ideation: No-Not Currently/Within Last 6 Months Thoughts of Harm to Others: No-Not Currently Present/Within Last 6 Months Current Homicidal Intent: No Current Homicidal Plan: No Access to Homicidal Means: No Identified Victim: none History of harm to others?: No Assessment of Violence: None Noted Does patient have access to weapons?: No Criminal Charges Pending?: No Does patient have a court date: No Prior Inpatient Therapy: Prior Inpatient Therapy: Yes Prior Therapy Dates: 2020, 2019, 2015 Prior Therapy Facilty/Provider([facility]): [facility] Reason for Treatment: depression, alcohol use Prior Outpatient Therapy: Prior Outpatient Therapy: Yes Prior Therapy Dates: ongoing Prior Therapy Facilty/Provider(s): [facility] Reason for Treatment: med management and therapy Does patient have an ACCT team?: No Does patient have Intensive In-House Services?  : No Does patient have [facility] services? : No Does patient have P4CC services?: No  Past Medical History:  Past Medical History:  Diagnosis Date  . Alcoholism (HCC)    History reviewed. No pertinent surgical history. Family History:  Family History  Family history unknown: Yes   Family Psychiatric  History: unknown Social History:  Social History   Substance and Sexual Activity  Alcohol Use Yes   Comment: last drank just PTA     Social History   Substance and Sexual Activity  Drug Use Yes  . Types: Marijuana   Comment: last used 01/23/19    Social History   Socioeconomic History  . Marital status: Single    Spouse name: Not on file  . Number of children: Not on file  . Years of education: Not  on file  . Highest education level: Not on file  Occupational History  . Not on file   Social Needs  . Financial resource strain: Not on file  . Food insecurity    Worry: Not on file    Inability: Not on file  . Transportation needs    Medical: Not on file    Non-medical: Not on file  Tobacco Use  . Smoking status: Current Every Day Smoker    Types: Cigars  . Smokeless tobacco: Former Network engineer and Sexual Activity  . Alcohol use: Yes    Comment: last drank just PTA  . Drug use: Yes    Types: Marijuana    Comment: last used 01/23/19  . Sexual activity: Not on file  Lifestyle  . Physical activity    Days per week: Not on file    Minutes per session: Not on file  . Stress: Not on file  Relationships  . Social Herbalist on phone: Not on file    Gets together: Not on file    Attends religious service: Not on file    Active member of club or organization: Not on file    Attends meetings of clubs or organizations: Not on file    Relationship status: Not on file  Other Topics Concern  . Not on file  Social History Narrative  . Not on file   Additional Social History:    Allergies:   Allergies  Allergen Reactions  . Penicillins Anaphylaxis and Rash    Has patient had a PCN reaction causing immediate rash, facial/tongue/throat swelling, SOB or lightheadedness with hypotension: Y Has patient had a PCN reaction causing severe rash involving mucus membranes or skin necrosis: Y Has patient had a PCN reaction that required hospitalization: N Has patient had a PCN reaction occurring within the last 10 years: N If all of the above answers are "NO", then may proceed with Cephalosporin use.     Labs:  Results for orders placed or performed during the hospital encounter of 02/08/19 (from the past 48 hour(s))  Urine rapid drug screen (hosp performed)     Status: None   Collection Time: 02/08/19  3:30 PM  Result Value Ref Range   Opiates NONE DETECTED NONE DETECTED   Cocaine NONE DETECTED NONE DETECTED   Benzodiazepines NONE DETECTED NONE DETECTED    Amphetamines NONE DETECTED NONE DETECTED   Tetrahydrocannabinol NONE DETECTED NONE DETECTED   Barbiturates NONE DETECTED NONE DETECTED    Comment: (NOTE) DRUG SCREEN FOR MEDICAL PURPOSES ONLY.  IF CONFIRMATION IS NEEDED FOR ANY PURPOSE, NOTIFY LAB WITHIN 5 DAYS. LOWEST DETECTABLE LIMITS FOR URINE DRUG SCREEN Drug Class                     Cutoff (ng/mL) Amphetamine and metabolites    1000 Barbiturate and metabolites    200 Benzodiazepine                 130 Tricyclics and metabolites     300 Opiates and metabolites        300 Cocaine and metabolites        300 THC                            50 Performed at [facility], [address] [place], [place] [address]   Urinalysis, Routine w reflex microscopic     Status:  Abnormal   Collection Time: 02/08/19  3:30 PM  Result Value Ref Range   Color, Urine YELLOW YELLOW   APPearance CLEAR CLEAR   Specific Gravity, Urine <1.005 (L) 1.005 - 1.030   pH 6.0 5.0 - 8.0   Glucose, UA NEGATIVE NEGATIVE mg/dL   Hgb urine dipstick NEGATIVE NEGATIVE   Bilirubin Urine NEGATIVE NEGATIVE   Ketones, ur NEGATIVE NEGATIVE mg/dL   Protein, ur NEGATIVE NEGATIVE mg/dL   Nitrite NEGATIVE NEGATIVE   Leukocytes,Ua NEGATIVE NEGATIVE    Comment: Microscopic not done on urines with negative protein, blood, leukocytes, nitrite, or glucose < 500 mg/dL. Performed at [facility], [address] [place], [place] [address]   Comprehensive metabolic panel     Status: Abnormal   Collection Time: 02/08/19  4:05 PM  Result Value Ref Range   Sodium 140 135 - 145 mmol/L   Potassium 4.0 3.5 - 5.1 mmol/L   Chloride 107 98 - 111 mmol/L   CO2 20 (L) 22 - 32 mmol/L   Glucose, Bld 110 (H) 70 - 99 mg/dL   BUN 12 6 - 20 mg/dL   Creatinine, Ser 6.04 0.61 - 1.24 mg/dL   Calcium 9.6 8.9 - 54.0 mg/dL   Total Protein 7.9 6.5 - 8.1 g/dL   Albumin 4.8 3.5 - 5.0 g/dL   AST 26 15 - 41 U/L   ALT 28 0 - 44 U/L   Alkaline Phosphatase 62 38 - 126 U/L    Total Bilirubin 0.6 0.3 - 1.2 mg/dL   GFR calc non Af Amer >60 >60 mL/min   GFR calc Af Amer >60 >60 mL/min   Anion gap 13 5 - 15    Comment: Performed at [facility], [address] [place], [place] [address]  CBC with Diff     Status: None   Collection Time: 02/08/19  4:05 PM  Result Value Ref Range   WBC 7.7 4.0 - 10.5 K/uL   RBC 5.30 4.22 - 5.81 MIL/uL   Hemoglobin 16.0 13.0 - 17.0 g/dL   HCT 14.7 82.9 - 56.2 %   MCV 89.2 80.0 - 100.0 fL   MCH 30.2 26.0 - 34.0 pg   MCHC 33.8 30.0 - 36.0 g/dL   RDW 13.0 86.5 - 78.4 %   Platelets 382 150 - 400 K/uL   nRBC 0.0 0.0 - 0.2 %   Neutrophils Relative % 48 %   Neutro Abs 3.6 1.7 - 7.7 K/uL   Lymphocytes Relative 41 %   Lymphs Abs 3.2 0.7 - 4.0 K/uL   Monocytes Relative 8 %   Monocytes Absolute 0.6 0.1 - 1.0 K/uL   Eosinophils Relative 2 %   Eosinophils Absolute 0.2 0.0 - 0.5 K/uL   Basophils Relative 1 %   Basophils Absolute 0.1 0.0 - 0.1 K/uL   Immature Granulocytes 0 %   Abs Immature Granulocytes 0.03 0.00 - 0.07 K/uL    Comment: Performed at [facility], [address] [place], [place] [address]  Acetaminophen level     Status: Abnormal   Collection Time: 02/08/19  4:05 PM  Result Value Ref Range   Acetaminophen (Tylenol), Serum <10 (L) 10 - 30 ug/mL    Comment: (NOTE) Therapeutic concentrations vary significantly. A range of 10-30 ug/mL  may be an effective concentration for many patients. However, some  are best treated at concentrations outside of this range. Acetaminophen concentrations >150 ug/mL at 4 hours after ingestion  and >50 ug/mL at 12 hours after ingestion are often associated with  toxic reactions. Performed at [facility], [address] [place], [place] [address]   Salicylate level     Status: None   Collection Time: 02/08/19  4:05 PM  Result Value Ref Range   Salicylate Lvl <7.0 2.8 - 30.0 mg/dL    Comment: Performed at [facility], [address] [place], [place] [address]  Ethanol     Status: Abnormal   Collection Time: 02/08/19  4:10 PM  Result Value Ref Range   Alcohol, Ethyl (B) 171 (H) <10 mg/dL    Comment: (NOTE) Lowest detectable limit for serum alcohol is 10 mg/dL. For medical purposes only. Performed at [name], [address] [place], [place] [address]   SARS Coronavirus 2 [facility] order, Performed in [facility] hospital lab) Nasopharyngeal Nasopharyngeal Swab     Status: None   Collection Time: 02/09/19  6:22 AM   Specimen: Nasopharyngeal Swab  Result Value Ref Range   SARS Coronavirus 2 NEGATIVE NEGATIVE    Comment: (NOTE) If result is NEGATIVE SARS-CoV-2 target nucleic acids are NOT DETECTED. The SARS-CoV-2 RNA is generally detectable in upper and lower  respiratory specimens during the acute phase of infection. The lowest  concentration of SARS-CoV-2 viral copies this assay can detect is 250  copies / mL. A negative result does not preclude SARS-CoV-2 infection  and should not be used as the sole basis for treatment or other  patient management decisions.  A negative result may occur with  improper specimen collection / handling, submission of specimen other  than nasopharyngeal swab, presence of viral mutation(s) within the  areas targeted by this assay, and inadequate number of viral copies  (<250 copies / mL). A negative result must be combined with clinical  observations, patient history, and epidemiological information. If result is POSITIVE SARS-CoV-2 target nucleic acids are DETECTED. The SARS-CoV-2 RNA is generally detectable in upper and lower  respiratory specimens dur ing the acute phase of infection.  Positive  results are indicative of active infection with SARS-CoV-2.  Clinical  correlation with patient history and other diagnostic information is  necessary to determine patient infection status.  Positive results do  not rule out bacterial infection or co-infection with other  viruses. If result is PRESUMPTIVE POSTIVE SARS-CoV-2 nucleic acids MAY BE PRESENT.   A presumptive positive result was obtained on the submitted specimen  and confirmed on repeat testing.  While 2019 novel coronavirus  (SARS-CoV-2) nucleic acids may be present in the submitted sample  additional confirmatory testing may be necessary for epidemiological  and / or clinical management purposes  to differentiate between  SARS-CoV-2 and other Sarbecovirus currently known to infect humans.  If clinically indicated additional testing with an alternate test  methodology [phone]) is advised. The SARS-CoV-2 RNA is generally  detectable in upper and lower respiratory sp ecimens during the acute  phase of infection. The expected result is Negative. Fact Sheet for Patients:  BoilerBrush.com.cy Fact Sheet for Healthcare Providers: https://pope.com/ This test is not yet approved or cleared by the [place] FDA and has been authorized for detection and/or diagnosis of SARS-CoV-2 by FDA under an Emergency Use Authorization (EUA).  This EUA will remain in effect (meaning this test can be used) for the duration of the COVID-19 declaration under Section 564(b)(1) of the Act, 21 U.S.C. section 360bbb-3(b)(1), unless the authorization is terminated or revoked sooner.  Performed at [facility], [address] [place], [place] [address]     Medications:  Current Facility-Administered Medications  Medication Dose Route Frequency Provider Last Rate Last Dose  . escitalopram (LEXAPRO) tablet 20 mg  20 mg Oral Daily Ward, Kristen N, DO      . LORazepam (ATIVAN) injection 0-4 mg  0-4 mg Intravenous Q6H Sabas SousBero, Nathanie M, MD       Or  . LORazepam (ATIVAN) tablet 0-4 mg  0-4 mg Oral Q6H Sabas SousBero, Siddhartha M, MD   1 mg at 02/09/19 1248  . [START ON 02/11/2019] LORazepam (ATIVAN) injection 0-4 mg  0-4 mg Intravenous Q12H Sabas SousBero, Zaylyn M, MD       Or  .  Melene Muller[START ON 02/11/2019] LORazepam (ATIVAN) tablet 0-4 mg  0-4 mg Oral Q12H Sabas SousBero, Glennis M, MD      . QUEtiapine (SEROQUEL) tablet 75 mg  75 mg Oral QHS Ward, Kristen N, DO      . thiamine (VITAMIN B-1) tablet 100 mg  100 mg Oral Daily Sabas SousBero, Marcell M, MD   100 mg at 02/09/19 1250   Or  . thiamine (B-1) injection 100 mg  100 mg Intravenous Daily Sabas SousBero, Login M, MD       No current outpatient medications on file.    Musculoskeletal: Strength & Muscle Tone: within normal limits Gait & Station: normal Patient leans: N/A  Psychiatric Specialty Exam: Physical Exam  Nursing note and vitals reviewed. Constitutional: He is oriented to person, place, and time. He appears well-developed.  HENT:  Head: Normocephalic.  Neck: Normal range of motion.  Cardiovascular: Normal rate.  Respiratory: Effort normal.  Musculoskeletal: Normal range of motion.  Neurological: He is alert and oriented to person, place, and time.  Psychiatric: His behavior is normal.    Review of Systems  Constitutional: Negative.   HENT: Negative.   Eyes: Negative.   Respiratory: Negative.   Cardiovascular: Negative.   Gastrointestinal: Negative.   Genitourinary: Negative.   Musculoskeletal: Negative.   Skin: Negative.   Neurological: Negative.   Endo/Heme/Allergies: Negative.   Psychiatric/Behavioral: Positive for depression, substance abuse and suicidal ideas.    Blood pressure (!) 130/97, pulse 90, temperature 98.9 F (37.2 C), temperature source Oral, resp. rate (!) 24, height 6' (1.829 m), weight 86.2 kg, SpO2 100 %.Body mass index is 25.77 kg/m.  General Appearance: Casual  Eye Contact:  Minimal  Speech:  Clear and Coherent  Volume:  Normal  Mood:  Depressed  Affect:  Depressed  Thought Process:  Coherent and Descriptions of Associations: Intact  Orientation:  Full (Time, Place, and Person)  Thought Content:  Logical  Suicidal Thoughts:  No  Homicidal Thoughts:  No  Memory:  Immediate;   Good Recent;    Good Remote;   Good  Judgement:  Fair  Insight:  Fair  Psychomotor Activity:  Normal  Concentration:  Concentration: Fair and Attention Span: Fair  Recall:  Good  Fund of Knowledge:  Fair  Language:  Good  Akathisia:  No  Handed:  Right  AIMS (if indicated):     Assets:  Communication Skills  ADL's:  Intact  Cognition:  WNL  Sleep:        Treatment Plan Summary: Daily contact with patient to assess and evaluate symptoms and progress in treatment  Disposition: Recommend psychiatric Inpatient admission when medically cleared.  This service was provided via telemedicine using a 2-way, interactive audio and video technology.  Names of all persons participating  in this telemedicine service and their role in this encounter. Name: Masyn Rostro Role: Patient  Name: Berneice Heinrich Role: FNP    Patrcia Dolly, FNP 02/09/2019 1:40 PM

## 2019-02-09 NOTE — ED Notes (Signed)
Spoke with Ava at [facility], she will speak with placement to make aware that we need [facility] bed for pt.

## 2019-02-09 NOTE — Progress Notes (Signed)
Pt meets inpatient criteria per Letitia Libra, NP. Referral information has been sent to the following hospitals for review:  [place]  [facility]  [facility]       Disposition will continue to assist with inpatient placement needs.   Audree Camel, LCSW, [place] Disposition [facility]/TTS [phone]

## 2019-02-09 NOTE — ED Notes (Signed)
Pt reports right side hip pain and requests dr to eval. EDP notified

## 2019-02-09 NOTE — ED Notes (Signed)
Pt awake  VS obtained  Water given to pt  No complaints voiced

## 2019-02-09 NOTE — ED Notes (Signed)
Russell Nichols at [facility] calling for consult with patient. Tele monitor in room

## 2019-02-09 NOTE — ED Notes (Addendum)
Pt reports feeling anxious. Pt reports feeling like he doesn't want to wake up when asked abt suicidal ideations. Pt reports plan, states he "would jump", states that he jumped in the past but it didn't work. Pt given cereal and milk, water for breakfast as requested

## 2019-02-09 NOTE — ED Notes (Signed)
Otila Kluver [phone]

## 2019-02-09 NOTE — ED Notes (Signed)
Pt resting quietly in bed with eyes closed  Sitter at bedside  No acute distress noted at this time

## 2019-02-09 NOTE — ED Notes (Signed)
Pt awake, used urinal and given water. Pt is calm and cooperative at this time.

## 2019-02-09 NOTE — ED Notes (Signed)
Spoke to [facility] and [facility] regarding possible transfer, unable to accept pt at this time due to pt volumes.  Will continue to monitor patient.

## 2019-02-09 NOTE — ED Provider Notes (Signed)
5:50 AM  Pt has been resting comfortably.  Under IVC currently for suicidal ideation.  Awaiting inpatient psychiatric placement.  It appears he was recently seen by behavioral health in [facility] and started on Lexapro 20 mg and Seroquel 75 mg daily.  These medications were started on 01/23/2019.  We will restart these medications here in the ED.  COVID has also been ordered given patient will need psychiatric placement.  I reviewed all nursing notes, vitals, pertinent previous records, EKGs, lab and urine results, imaging (as available).    Ward, Delice Bison, DO 02/09/19 [phone]

## 2019-02-09 NOTE — ED Notes (Signed)
Lexapro dose 20mg  for tomorrow is in pyxis under home medications

## 2019-02-09 NOTE — ED Notes (Signed)
Woke pt up  VS obtained  Water given  Offered food, refused at this time   Cooperative in words and actions

## 2019-02-09 NOTE — ED Notes (Signed)
Per Judson Roch, Education officer, museum, [facility] does not have a bed available for patient at this time. Information given to Comptroller. Thi RN informed Education officer, museum sarah that we are going to have pt transferred to ED at [place] if avail d/t time spent in this ED being greater than 24 hrs

## 2019-02-09 NOTE — ED Notes (Signed)
Pt ate breakfast-cereal and milk, lying in bed with head covered.

## 2019-02-10 ENCOUNTER — Inpatient Hospital Stay (HOSPITAL_COMMUNITY)
Admission: AD | Admit: 2019-02-10 | Discharge: 2019-02-15 | DRG: 881 | Disposition: A | Discharge status: Home / Self Care | Payer: No Typology Code available for payment source | Source: Intra-hospital | Attending: Psychiatry | Admitting: Psychiatry

## 2019-02-10 ENCOUNTER — Other Ambulatory Visit: Payer: Self-pay

## 2019-02-10 DIAGNOSIS — F410 Panic disorder [episodic paroxysmal anxiety] without agoraphobia: Secondary | ICD-10-CM | POA: Diagnosis present

## 2019-02-10 DIAGNOSIS — Y906 Blood alcohol level of 120-199 mg/100 ml: Secondary | ICD-10-CM | POA: Diagnosis present

## 2019-02-10 DIAGNOSIS — F17290 Nicotine dependence, other tobacco product, uncomplicated: Secondary | ICD-10-CM | POA: Diagnosis present

## 2019-02-10 DIAGNOSIS — G4700 Insomnia, unspecified: Secondary | ICD-10-CM | POA: Diagnosis present

## 2019-02-10 DIAGNOSIS — F12988 Cannabis use, unspecified with other cannabis-induced disorder: Secondary | ICD-10-CM | POA: Diagnosis not present

## 2019-02-10 DIAGNOSIS — F419 Anxiety disorder, unspecified: Secondary | ICD-10-CM | POA: Diagnosis present

## 2019-02-10 DIAGNOSIS — Z62810 Personal history of physical and sexual abuse in childhood: Secondary | ICD-10-CM | POA: Diagnosis present

## 2019-02-10 DIAGNOSIS — F4310 Post-traumatic stress disorder, unspecified: Secondary | ICD-10-CM | POA: Diagnosis present

## 2019-02-10 DIAGNOSIS — F1024 Alcohol dependence with alcohol-induced mood disorder: Secondary | ICD-10-CM

## 2019-02-10 DIAGNOSIS — R45851 Suicidal ideations: Secondary | ICD-10-CM | POA: Diagnosis present

## 2019-02-10 DIAGNOSIS — F329 Major depressive disorder, single episode, unspecified: Secondary | ICD-10-CM | POA: Diagnosis present

## 2019-02-10 DIAGNOSIS — Z590 Homelessness: Secondary | ICD-10-CM

## 2019-02-10 DIAGNOSIS — F1210 Cannabis abuse, uncomplicated: Secondary | ICD-10-CM | POA: Diagnosis present

## 2019-02-10 DIAGNOSIS — F1020 Alcohol dependence, uncomplicated: Secondary | ICD-10-CM | POA: Diagnosis present

## 2019-02-10 DIAGNOSIS — Z880 Allergy status to penicillin: Secondary | ICD-10-CM

## 2019-02-10 DIAGNOSIS — R030 Elevated blood-pressure reading, without diagnosis of hypertension: Secondary | ICD-10-CM | POA: Diagnosis present

## 2019-02-10 DIAGNOSIS — Z79899 Other long term (current) drug therapy: Secondary | ICD-10-CM | POA: Diagnosis not present

## 2019-02-10 DIAGNOSIS — F332 Major depressive disorder, recurrent severe without psychotic features: Secondary | ICD-10-CM | POA: Diagnosis not present

## 2019-02-10 MED ORDER — ONDANSETRON 4 MG PO TBDP: 4.0000 mg | ORAL_TABLET | Freq: Four times a day (QID) | ORAL | Status: DC | PRN

## 2019-02-10 MED ORDER — LORAZEPAM 1 MG PO TABS: 1.0000 mg | ORAL_TABLET | Freq: Four times a day (QID) | ORAL | Status: DC | PRN

## 2019-02-10 MED ORDER — THIAMINE HCL 100 MG/ML IJ SOLN: 100.0000 mg | Freq: Once | INTRAMUSCULAR | Status: DC

## 2019-02-10 MED ORDER — LORAZEPAM 1 MG PO TABS
1.0000 mg | ORAL_TABLET | Freq: Two times a day (BID) | ORAL | Status: AC
  Administered 2019-02-12 – 2019-02-13 (×2): 1 mg via ORAL
  Filled 2019-02-10: qty 1

## 2019-02-10 MED ORDER — LORAZEPAM 1 MG PO TABS: 1.0000 mg | ORAL_TABLET | Freq: Four times a day (QID) | ORAL | Status: AC | PRN

## 2019-02-10 MED ORDER — HYDROXYZINE HCL 25 MG PO TABS
25.0000 mg | ORAL_TABLET | Freq: Four times a day (QID) | ORAL | Status: AC | PRN
  Administered 2019-02-11 – 2019-02-13 (×3): 25 mg via ORAL
  Filled 2019-02-10 (×3): qty 1

## 2019-02-10 MED ORDER — VITAMIN B-1 100 MG PO TABS
100.0000 mg | ORAL_TABLET | Freq: Every day | ORAL | Status: DC
  Administered 2019-02-11 – 2019-02-14 (×4): 100 mg via ORAL
  Filled 2019-02-10 (×6): qty 1

## 2019-02-10 MED ORDER — HYDROXYZINE HCL 25 MG PO TABS: 25.0000 mg | ORAL_TABLET | Freq: Four times a day (QID) | ORAL | Status: DC | PRN

## 2019-02-10 MED ORDER — ADULT MULTIVITAMIN W/MINERALS CH
1.0000 | ORAL_TABLET | Freq: Every day | ORAL | Status: DC
  Filled 2019-02-10 (×2): qty 1

## 2019-02-10 MED ORDER — LORAZEPAM 1 MG PO TABS
1.0000 mg | ORAL_TABLET | Freq: Four times a day (QID) | ORAL | Status: AC
  Administered 2019-02-10 – 2019-02-11 (×4): 1 mg via ORAL
  Filled 2019-02-10 (×3): qty 1

## 2019-02-10 MED ORDER — LORAZEPAM 1 MG PO TABS
1.0000 mg | ORAL_TABLET | Freq: Three times a day (TID) | ORAL | Status: AC
  Administered 2019-02-11 – 2019-02-12 (×2): 1 mg via ORAL
  Filled 2019-02-10 (×4): qty 1

## 2019-02-10 MED ORDER — VITAMIN B-1 100 MG PO TABS
100.0000 mg | ORAL_TABLET | Freq: Every day | ORAL | Status: DC
  Filled 2019-02-10: qty 1

## 2019-02-10 MED ORDER — ADULT MULTIVITAMIN W/MINERALS CH
1.0000 | ORAL_TABLET | Freq: Every day | ORAL | Status: DC
  Administered 2019-02-10 – 2019-02-15 (×5): 1 via ORAL
  Filled 2019-02-10 (×7): qty 1

## 2019-02-10 MED ORDER — LOPERAMIDE HCL 2 MG PO CAPS: 2.0000 mg | ORAL_CAPSULE | ORAL | Status: DC | PRN

## 2019-02-10 MED ORDER — IBUPROFEN 600 MG PO TABS: 600.0000 mg | ORAL_TABLET | Freq: Four times a day (QID) | ORAL | Status: DC | PRN

## 2019-02-10 MED ORDER — IBUPROFEN 400 MG PO TABS
600.0000 mg | ORAL_TABLET | Freq: Four times a day (QID) | ORAL | Status: DC | PRN
  Administered 2019-02-10: 600 mg via ORAL
  Filled 2019-02-10: qty 1

## 2019-02-10 MED ORDER — LORAZEPAM 1 MG PO TABS
4.0000 mg | ORAL_TABLET | Freq: Once | ORAL | Status: AC
  Administered 2019-02-10: 4 mg via ORAL
  Filled 2019-02-10: qty 4

## 2019-02-10 MED ORDER — ESCITALOPRAM OXALATE 20 MG PO TABS
20.0000 mg | ORAL_TABLET | Freq: Every day | ORAL | Status: DC
  Administered 2019-02-11 – 2019-02-15 (×5): 20 mg via ORAL
  Filled 2019-02-10 (×6): qty 1

## 2019-02-10 MED ORDER — LORAZEPAM 1 MG PO TABS
1.0000 mg | ORAL_TABLET | Freq: Every day | ORAL | Status: AC
  Administered 2019-02-14: 09:00:00 1 mg via ORAL
  Filled 2019-02-10: qty 1

## 2019-02-10 NOTE — Progress Notes (Signed)
Psychoeducational Group Note  Date:  02/10/2019 Time:  2339  Group Topic/Focus:  Wrap-Up Group:   The focus of this group is to help patients review their daily goal of treatment and discuss progress on daily workbooks.  Participation Level: Did Not Attend  Participation Quality:  Not Applicable  Affect:  Not Applicable  Cognitive:  Not Applicable  Insight:  Not Applicable  Engagement in Group: Not Applicable  Additional Comments:  The patient did not attend group this evening since he was too agitated.   Archie Balboa S 02/10/2019, 11:39 PM

## 2019-02-10 NOTE — Progress Notes (Signed)
Patient ID: Russell Nichols, male   DOB: 14-Nov-1970, 48 y.o.   MRN: [id number]   Reassessment   Patient re-evaluated after he was  Taken to the ED following reports of suicidal ideations and depression.  During this evaluation, he is alert and oriented x4, calm and cooperative. He endorses ongoing suicidal thoughts with a plan to, " jump off a bridge." He reports multiple (at least 2) suicide attempts int he past and as per chart review, he was admitted to [facility], for a similar presentation. He endorses main stressor as homelessness after being kicked out of a place of resident 1-2 weeks ago. Reports he has been struggling with alcohol abuse for many years. His ethanol level was 171. Reports he recently (01/23/2019) stopped smoking cannabis. He denies other substance abuse or use. He states, " I have no desire to live and is unable to contract for safety if he was to be discharge from the hospital. He endorses ongoing depressive symptoms describes as significant low mood, hopelessness, worthlessness, irritability, and suicidal thoughts. He has a history of psychiatric hospitalizations and was here at [facility]  July 2019.  He denieshomicidal ideation and also denies hallucinations or psychosis.    Based off of my evaluation, I am continuing  to recommend inpatient psychiatric hospitalization for his safety and stabilization. CSW will continue to work on referrals.

## 2019-02-10 NOTE — ED Notes (Signed)
Pt c/o hip pain.  Dr. Sherry Ruffing notified.  Pt also c/o being tired of being here.   Pt angry, wanting something else done.  Dr. Sherry Ruffing to speak to patient.

## 2019-02-10 NOTE — Progress Notes (Signed)
Admission Note: Patient is a 48 year old male admitted to the unit for suicidal ideation and substance abuse.  Presents with irritable affect and mood.  Demanding to leave against medical advice.  Refused skin assessment.  Personal belongings completed.  No contraband found.  Refused to cooperate or sign treatment plan of care.  Patient agreed to come to the unit only after show of support was called. Patient with MD for assessment.

## 2019-02-10 NOTE — Progress Notes (Signed)
Psychoeducational Group Note  Date:  02/10/2019 Time:  2141  Group Topic/Focus:  Wrap-Up Group:   The focus of this group is to help patients review their daily goal of treatment and discuss progress on daily workbooks.  Participation Level: Did Not Attend  Participation Quality:  Not Applicable  Affect:  Not Applicable  Cognitive:  Not Applicable  Insight:  Not Applicable  Engagement in Group: Not Applicable  Additional Comments:  The patient did not attend group.   Archie Balboa S 02/10/2019, 9:41 PM

## 2019-02-10 NOTE — Tx Team (Signed)
Initial Treatment Plan 02/10/2019 6:21 PM [name] YOV:[id number]    PATIENT STRESSORS: Health problems Legal issue Substance abuse   PATIENT STRENGTHS: Capable of independent living Communication skills   PATIENT IDENTIFIED PROBLEMS: Suicidal ideation  Anxiety  Alcohol Abuse                 DISCHARGE CRITERIA:  Ability to meet basic life and health needs Adequate post-discharge living arrangements  PRELIMINARY DISCHARGE PLAN: Outpatient therapy Return to previous living arrangement  PATIENT/FAMILY INVOLVEMENT: This treatment plan has been presented to and reviewed with the patient, Russell Nichols, and/or family member.  The patient and family have been given the opportunity to ask questions and make suggestions.  Vela Prose, RN 02/10/2019, 6:21 PM

## 2019-02-10 NOTE — ED Notes (Signed)
ED TO INPATIENT HANDOFF REPORT  ED Nurse Name and Phone #: M. Lynita Lombard, RN  S Name/Age/Gender Russell Nichols 48 y.o. male Room/Bed: MH12/MH12  Code Status   Code Status: Prior  Home/SNF/Other Home or Rehab Patient oriented to: self, place, time and situation Is this baseline? Yes   Triage Complete: Triage complete  Chief Complaint suicidal  Triage Note Pt here for SI-recent admn SI-pt NAD-steady gait   Allergies Allergies  Allergen Reactions  . Penicillins Anaphylaxis and Rash    Has patient had a PCN reaction causing immediate rash, facial/tongue/throat swelling, SOB or lightheadedness with hypotension: Y Has patient had a PCN reaction causing severe rash involving mucus membranes or skin necrosis: Y Has patient had a PCN reaction that required hospitalization: N Has patient had a PCN reaction occurring within the last 10 years: N If all of the above answers are "NO", then may proceed with Cephalosporin use.     Level of Care/Admitting Diagnosis ED Disposition    None      B Medical/Surgery History Past Medical History:  Diagnosis Date  . Alcoholism (HCC)    History reviewed. No pertinent surgical history.   A IV Location/Drains/Wounds Patient Lines/Drains/Airways Status   Active Line/Drains/Airways    None          Intake/Output Last 24 hours  Intake/Output Summary (Last 24 hours) at 02/10/2019 1617 Last data filed at 02/10/2019 1152 Gross per 24 hour  Intake 400 ml  Output 500 ml  Net -100 ml    Labs/Imaging Results for orders placed or performed during the hospital encounter of 02/08/19 (from the past 48 hour(s))  SARS Coronavirus 2 [facility] order, Performed in [facility] hospital lab) Nasopharyngeal Nasopharyngeal Swab     Status: None   Collection Time: 02/09/19  6:22 AM   Specimen: Nasopharyngeal Swab  Result Value Ref Range   SARS Coronavirus 2 NEGATIVE NEGATIVE    Comment: (NOTE) If result is NEGATIVE SARS-CoV-2 target nucleic  acids are NOT DETECTED. The SARS-CoV-2 RNA is generally detectable in upper and lower  respiratory specimens during the acute phase of infection. The lowest  concentration of SARS-CoV-2 viral copies this assay can detect is 250  copies / mL. A negative result does not preclude SARS-CoV-2 infection  and should not be used as the sole basis for treatment or other  patient management decisions.  A negative result may occur with  improper specimen collection / handling, submission of specimen other  than nasopharyngeal swab, presence of viral mutation(s) within the  areas targeted by this assay, and inadequate number of viral copies  (<250 copies / mL). A negative result must be combined with clinical  observations, patient history, and epidemiological information. If result is POSITIVE SARS-CoV-2 target nucleic acids are DETECTED. The SARS-CoV-2 RNA is generally detectable in upper and lower  respiratory specimens dur ing the acute phase of infection.  Positive  results are indicative of active infection with SARS-CoV-2.  Clinical  correlation with patient history and other diagnostic information is  necessary to determine patient infection status.  Positive results do  not rule out bacterial infection or co-infection with other viruses. If result is PRESUMPTIVE POSTIVE SARS-CoV-2 nucleic acids MAY BE PRESENT.   A presumptive positive result was obtained on the submitted specimen  and confirmed on repeat testing.  While 2019 novel coronavirus  (SARS-CoV-2) nucleic acids may be present in the submitted sample  additional confirmatory testing may be necessary for epidemiological  and / or clinical management purposes  to differentiate between  SARS-CoV-2 and other Sarbecovirus currently known to infect humans.  If clinically indicated additional testing with an alternate test  methodology [phone]) is advised. The SARS-CoV-2 RNA is generally  detectable in upper and lower respiratory  sp ecimens during the acute  phase of infection. The expected result is Negative. Fact Sheet for Patients:  StrictlyIdeas.no Fact Sheet for Healthcare Providers: BankingDealers.co.za This test is not yet approved or cleared by the [place] FDA and has been authorized for detection and/or diagnosis of SARS-CoV-2 by FDA under an Emergency Use Authorization (EUA).  This EUA will remain in effect (meaning this test can be used) for the duration of the COVID-19 declaration under Section 564(b)(1) of the Act, 21 U.S.C. section 360bbb-3(b)(1), unless the authorization is terminated or revoked sooner. Performed at [facility] [address] [place] [address] [place], [place] [address]    No results found.  Pending Labs [employer] (From admission, onward)    Start     Ordered   Signed and Held  Hemoglobin A1c  [facility] Morning draw,   R     Signed and Held   Signed and Held  Lipid panel  [facility] Morning draw,   R     Signed and Held   Signed and Held  TSH  [facility] Morning draw,   R     Signed and Held   Signed and Held  Prolactin  [facility] Morning draw,   R     Signed and Held          Vitals/Pain Today's Vitals   02/09/19 2306 02/10/19 0600 02/10/19 1049 02/10/19 1400  BP: 97/60 98/69 (!) 144/105 (!) 156/113  Pulse: 77 75 (!) 101 91  Resp: 16 16 19 18   Temp: 98 F (36.7 C) 98 F (36.7 C) 98.3 F (36.8 C) 98.7 F (37.1 C)  TempSrc: Oral Oral  Oral  SpO2: 97% 97% 98% 99%  Weight:      Height:      PainSc:        Isolation Precautions No active isolations  Medications Medications  LORazepam (ATIVAN) injection 0-4 mg ( Intravenous See Alternative 02/10/19 0959)    Or  LORazepam (ATIVAN) tablet 0-4 mg (3 mg Oral Given 02/10/19 0959)  LORazepam (ATIVAN) injection 0-4 mg (has no administration in time range)    Or  LORazepam (ATIVAN) tablet 0-4 mg (has no administration in time range)  thiamine (VITAMIN B-1) tablet 100 mg (100 mg Oral  Given 02/10/19 1009)    Or  thiamine (B-1) injection 100 mg ( Intravenous See Alternative 02/10/19 1009)  escitalopram (LEXAPRO) tablet 20 mg (20 mg Oral Given 02/10/19 1009)  QUEtiapine (SEROQUEL) tablet 75 mg (75 mg Oral Given 02/09/19 2215)  ibuprofen (ADVIL) tablet 600 mg (600 mg Oral Given 02/10/19 1248)  ziprasidone (GEODON) injection 20 mg (20 mg Intramuscular Given 02/08/19 1516)  diphenhydrAMINE (BENADRYL) injection 50 mg (50 mg Intramuscular Given 02/08/19 1516)  LORazepam (ATIVAN) injection 2 mg (2 mg Intramuscular Given 02/08/19 1516)  LORazepam (ATIVAN) injection 2 mg (2 mg Intramuscular Given 02/09/19 1914)  ziprasidone (GEODON) injection 20 mg (20 mg Intramuscular Given 02/09/19 1915)  LORazepam (ATIVAN) tablet 4 mg (4 mg Oral Given 02/10/19 1426)    Mobility walks Moderate fall risk   Focused Assessments Psych:  Pt awake, continues to express SI.  Pt does have some withdrawal symptoms from ETOH.  Pt has some agitation, shakes and irritability.   R Recommendations: See Admitting Provider Note  Report given to: Lanora ManisElizabeth, RN  Additional Notes:

## 2019-02-10 NOTE — ED Notes (Signed)
TTS cart at bedside. 

## 2019-02-10 NOTE — Progress Notes (Signed)
Pt accepted to [facility]; room 307-2     Dr. Dwyane Dee is the accepting provider.    Dr. Parke Poisson is the attending provider.    Call report to Rayle @ [facility] ED notified.     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    Pt is scheduled to arrive at [facility] after [place], [place], [place] Disposition CSW [facility]/TTS [phone]

## 2019-02-11 DIAGNOSIS — F12988 Cannabis use, unspecified with other cannabis-induced disorder: Secondary | ICD-10-CM

## 2019-02-11 DIAGNOSIS — Z590 Homelessness: Secondary | ICD-10-CM

## 2019-02-11 DIAGNOSIS — F1024 Alcohol dependence with alcohol-induced mood disorder: Secondary | ICD-10-CM

## 2019-02-11 DIAGNOSIS — F332 Major depressive disorder, recurrent severe without psychotic features: Secondary | ICD-10-CM

## 2019-02-11 LAB — LIPID PANEL
Cholesterol: 265 mg/dL — ABNORMAL HIGH (ref 0–200)
HDL: 61 mg/dL (ref >40)
LDL Cholesterol: 165 mg/dL — ABNORMAL HIGH (ref 0–99)
Total CHOL/HDL Ratio: 4.3 RATIO
Triglycerides: 197 mg/dL — ABNORMAL HIGH (ref <150)
VLDL: 39 mg/dL (ref 0–40)

## 2019-02-11 LAB — HEMOGLOBIN A1C
Hgb A1c MFr Bld: 5.2 % (ref 4.8–5.6)
Mean Plasma Glucose: 102.54 mg/dL

## 2019-02-11 LAB — TSH: TSH: 1.813 u[IU]/mL (ref 0.350–4.500)

## 2019-02-11 MED ORDER — QUETIAPINE FUMARATE 25 MG PO TABS
25.0000 mg | ORAL_TABLET | Freq: Every day | ORAL | Status: DC
  Administered 2019-02-11 – 2019-02-12 (×2): 25 mg via ORAL
  Filled 2019-02-11 (×4): qty 1
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
## 2019-02-11 NOTE — Progress Notes (Signed)
Patient ID: Russell Nichols, male   DOB: 06/26/1970, 48 y.o.   MRN: [id number]  [place] NOVEL CORONAVIRUS (COVID-19) DAILY CHECK-OFF SYMPTOMS - answer yes or no to each - every day NO YES  Have you had a fever in the past 24 hours?  . Fever (Temp > 37.80C / 100F) X   Have you had any of these symptoms in the past 24 hours? . New Cough .  Sore Throat  .  Shortness of Breath .  Difficulty Breathing .  Unexplained Body Aches   X   Have you had any one of these symptoms in the past 24 hours not related to allergies?   . Runny Nose .  Nasal Congestion .  Sneezing   X   If you have had runny nose, nasal congestion, sneezing in the past 24 hours, has it worsened?  X   EXPOSURES - check yes or no X   Have you traveled outside the state in the past 14 days?  X   Have you been in contact with someone with a confirmed diagnosis of COVID-19 or PUI in the past 14 days without wearing appropriate PPE?  X   Have you been living in the same home as a person with confirmed diagnosis of COVID-19 or a PUI (household contact)?    X   Have you been diagnosed with COVID-19?    X              What to do next: Answered NO to all: Answered YES to anything:   Proceed with unit schedule Follow the [facility] Inpatient Flowsheet.   

## 2019-02-11 NOTE — Progress Notes (Addendum)
D: Pt visible in dayroom. Pt interacting with peers appropriately. Pt was pleasant and hopeful of D/c soon. Pt was concerned that he takes 75 mg Seroquel at night, so he was encouraged to talk to the doctor tomorrow .  A: Pt was offered support and encouragement. Pt was given scheduled medications. Pt was encourage to attend groups. Q 15 minute checks were done for safety.  R:Pt attends groups and interacts well with peers and staff. Pt is taking medication. Pt has no complaints.Pt receptive to treatment and safety maintained on unit.

## 2019-02-11 NOTE — Progress Notes (Signed)
Adult Psychoeducational Group Note  Date:  02/11/2019 Time:  9:23 PM  Group Topic/Focus:  Wrap-Up Group:   The focus of this group is to help patients review their daily goal of treatment and discuss progress on daily workbooks.  Participation Level:  Active  Participation Quality:  Appropriate  Affect:  Appropriate  Cognitive:  Appropriate  Insight: Appropriate  Engagement in Group:  Engaged  Modes of Intervention:  Discussion  Additional Comments:  Patient attended group and participated.  Lorenda Grecco W Eyan Hagood 33/0/0762, 9:23 PM

## 2019-02-11 NOTE — BHH Group Notes (Signed)
LCSW Aftercare Discharge Planning Group Note  02/11/2019   Type of Group and Topic: Psychoeducational Group: Discharge Planning  Participation Level: Did Not Attend  Description of Group  Discharge planning group reviews patient's anticipated discharge plans and assists patients to anticipate and address any barriers to wellness/recovery in the community. Suicide prevention education is reviewed with patients in group.  Joellen Jersey, [place]  02/11/2019 3:12 PM

## 2019-02-11 NOTE — Progress Notes (Signed)
Patient's [facility], Cheo Mock [phone]) called twice to confirm patient is hospitalized and to inquire about a discharge date. Patient called the probation officer and reported that he was hospitalized.   Patient granted CSW permission to fax a letter to Officer Mock [phone]).  Stephanie Acre, LCSW-A Clinical Social Worker

## 2019-02-11 NOTE — BHH Suicide Risk Assessment (Signed)
[facility] Admission Suicide Risk Assessment   Nursing information obtained from:   Patient and chart Demographic factors:   48 year old single male, currently unemployed Current Mental Status:   See below Loss Factors:   Homelessness, unemployment, recent death of pet dog Historical Factors:   History of alcohol use disorder, history of depression Risk Reduction Factors:   Resilience Total Time spent with patient: 45 minutes Principal Problem: MDD versus alcohol induced mood disorder, alcohol use disorder Diagnosis:  Active Problems:   MDD (major depressive disorder)  Subjective Data:   Continued Clinical Symptoms:    The "Alcohol Use Disorders Identification Test", Guidelines for Use in Primary Care, Second Edition.  World Pharmacologist [facility]). Score between 0-7:  no or low risk or alcohol related problems. Score between 8-15:  moderate risk of alcohol related problems. Score between 16-19:  high risk of alcohol related problems. Score 20 or above:  warrants further diagnostic evaluation for alcohol dependence and treatment.   CLINICAL FACTORS:  48 year old male, presented to ED voluntarily on 9/28 reporting depression, neurovegetative symptoms and passive suicidal ideations.  He attempted to leave ED after registering and was eventually admitted under commitment.  He reports significant recent stressors which include homelessness (had been staying with a friend prior to admission but apparently cannot return there, recent death of pet dog, unemployment, recent relapse into heavy drinking.  Admission BAL 171.  Patient has a history of alcohol dependence, cannabis dependence, mood disorder/depression.    Psychiatric Specialty Exam: Physical Exam  ROS  Blood pressure (!) 154/107, pulse 92, temperature 98.6 F (37 C), resp. rate 18, height 6' (1.829 m), weight 87.1 kg, SpO2 99 %.Body mass index is 26.04 kg/m.  See admit note MSE   COGNITIVE FEATURES THAT CONTRIBUTE TO RISK:   Closed-mindedness and Loss of executive function    SUICIDE RISK:   Moderate:  Frequent suicidal ideation with limited intensity, and duration, some specificity in terms of plans, no associated intent, good self-control, limited dysphoria/symptomatology, some risk factors present, and identifiable protective factors, including available and accessible social support.  PLAN OF CARE: Patient will be admitted to inpatient psychiatric unit for stabilization and safety. Will provide and encourage milieu participation. Provide medication management and maked adjustments as needed.  We will also provide medication management to address potential withdrawal symptoms- will follow daily.    I certify that inpatient services furnished can reasonably be expected to improve the patient's condition.   Jenne Campus, MD 02/11/2019, 2:02 PM

## 2019-02-11 NOTE — BHH Group Notes (Signed)
[facility] Group Notes:  (Nursing/MHT/Case Management/Adjunct)  Date:  02/11/2019  Time:  10:00 AM  Type of Therapy:  Nurse Education  Participation Level:  Did Not Attend  Summary of Progress/Problems: pt's discussed crisis management and how this related to past/current crises they were/are facing. Pt's shared their resources to help others realize coping skills, support systems and resources that could be utilized. Pt's heavily focused on results of Covid and how these had impacted their routine. Pt was meditating and chose to continue instead of coming to group.  Marianna 02/11/2019, 11:50 AM

## 2019-02-11 NOTE — Plan of Care (Signed)
  Problem: Medication: Goal: Compliance with prescribed medication regimen will improve Outcome: Progressing   Problem: Education: Goal: Emotional status will improve Outcome: Not Progressing   Problem: Coping: Goal: Ability to identify and develop effective coping behavior will improve Outcome: Not Progressing

## 2019-02-11 NOTE — BHH Suicide Risk Assessment (Signed)
[facility] INPATIENT:  Family/Significant Other Suicide Prevention Education  Suicide Prevention Education:  Patient Refusal for Family/Significant Other Suicide Prevention Education: The patient Russell Nichols has refused to provide written consent for family/significant other to be provided Family/Significant Other Suicide Prevention Education during admission and/or prior to discharge.  Physician notified.  Joellen Jersey 02/11/2019, 9:14 AM
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
## 2019-02-11 NOTE — Progress Notes (Signed)
Patient shares that he was able to locate his friend and he is in a better mood.  He inquired about getting a brain scan. He is familiar with Dr.Farah and states "he said I have bone spurs in my brain, I wonder if they are growing." CSW encouraged patient to address this concern with his attending provider.  Stephanie Acre, LCSW-A Clinical Social Worker

## 2019-02-12 LAB — PROLACTIN: Prolactin: 22.9 ng/mL — ABNORMAL HIGH (ref 4.0–15.2)

## 2019-02-12 MED ORDER — LISINOPRIL 10 MG PO TABS
10.0000 mg | ORAL_TABLET | Freq: Every day | ORAL | Status: DC
  Administered 2019-02-12 – 2019-02-15 (×4): 10 mg via ORAL
  Filled 2019-02-12: qty 2
  Filled 2019-02-12 (×5): qty 1

## 2019-02-12 NOTE — Progress Notes (Signed)
Adult Psychoeducational Group Note  Date:  02/12/2019 Time:  11:15 PM  Group Topic/Focus:  Wrap-Up Group:   The focus of this group is to help patients review their daily goal of treatment and discuss progress on daily workbooks.  Participation Level:  Active  Participation Quality:  Appropriate  Affect:  Appropriate  Cognitive:  Appropriate  Insight: Appropriate  Engagement in Group:  Engaged  Modes of Intervention:  Education and Support  Additional Comments:  Patient attended and participated in group tonight.   Salley Scarlet [facility] 02/12/2019, 11:15 PM

## 2019-02-12 NOTE — Tx Team (Signed)
Interdisciplinary Treatment and Diagnostic Plan Update  02/12/2019 Time of Session: 9:30am Hunt Zajicek MRN: [id number]  Principal Diagnosis: <principal problem not specified>  Secondary Diagnoses: Active Problems:   MDD (major depressive disorder)   Current Medications:  Current Facility-Administered Medications  Medication Dose Route Frequency Provider Last Rate Last Dose  . escitalopram (LEXAPRO) tablet 20 mg  20 mg Oral Daily Mordecai Maes, NP   20 mg at 02/11/19 0850  . hydrOXYzine (ATARAX/VISTARIL) tablet 25 mg  25 mg Oral Q6H PRN Cobos, Myer Peer, MD   25 mg at 02/11/19 2123  . ibuprofen (ADVIL) tablet 600 mg  600 mg Oral Q6H PRN Mordecai Maes, NP      . LORazepam (ATIVAN) tablet 1 mg  1 mg Oral Q6H PRN Cobos, Myer Peer, MD      . LORazepam (ATIVAN) tablet 1 mg  1 mg Oral TID Cobos, Myer Peer, MD   1 mg at 02/11/19 1702   Followed by  . LORazepam (ATIVAN) tablet 1 mg  1 mg Oral BID Cobos, Myer Peer, MD       Followed by  . [START ON 02/14/2019] LORazepam (ATIVAN) tablet 1 mg  1 mg Oral Daily Cobos, Fernando A, MD      . multivitamin with minerals tablet 1 tablet  1 tablet Oral Daily Cobos, Myer Peer, MD   1 tablet at 02/10/19 1859  . QUEtiapine (SEROQUEL) tablet 25 mg  25 mg Oral QHS Cobos, Myer Peer, MD   25 mg at 02/11/19 2123  . thiamine (B-1) injection 100 mg  100 mg Intramuscular Once Cobos, Fernando A, MD      . thiamine (VITAMIN B-1) tablet 100 mg  100 mg Oral Daily Cobos, Myer Peer, MD   100 mg at 02/11/19 0850   PTA Medications: Medications Prior to Admission  Medication Sig Dispense Refill Last Dose  . escitalopram (LEXAPRO) 20 MG tablet Take 20 mg by mouth daily.     . sertraline (ZOLOFT) 25 MG tablet Take 75 mg by mouth daily.       Patient Stressors: Health problems Legal issue Substance abuse  Patient Strengths: Capable of independent living Communication skills  Treatment Modalities: Medication Management, Group therapy, Case management,   1 to 1 session with clinician, Psychoeducation, Recreational therapy.   Physician Treatment Plan for Primary Diagnosis: <principal problem not specified> Long Term Goal(s): Improvement in symptoms so as ready for discharge Improvement in symptoms so as ready for discharge   Short Term Goals: Ability to identify changes in lifestyle to reduce recurrence of condition will improve Ability to verbalize feelings will improve Ability to disclose and discuss suicidal ideas Ability to demonstrate self-control will improve Ability to identify and develop effective coping behaviors will improve Ability to maintain clinical measurements within normal limits will improve Ability to identify changes in lifestyle to reduce recurrence of condition will improve Ability to identify triggers associated with substance abuse/mental health issues will improve  Medication Management: Evaluate patient's response, side effects, and tolerance of medication regimen.  Therapeutic Interventions: 1 to 1 sessions, Unit Group sessions and Medication administration.  Evaluation of Outcomes: Not Met  Physician Treatment Plan for Secondary Diagnosis: Active Problems:   MDD (major depressive disorder)  Long Term Goal(s): Improvement in symptoms so as ready for discharge Improvement in symptoms so as ready for discharge   Short Term Goals: Ability to identify changes in lifestyle to reduce recurrence of condition will improve Ability to verbalize feelings will improve Ability to disclose and discuss  suicidal ideas Ability to demonstrate self-control will improve Ability to identify and develop effective coping behaviors will improve Ability to maintain clinical measurements within normal limits will improve Ability to identify changes in lifestyle to reduce recurrence of condition will improve Ability to identify triggers associated with substance abuse/mental health issues will improve     Medication Management:  Evaluate patient's response, side effects, and tolerance of medication regimen.  Therapeutic Interventions: 1 to 1 sessions, Unit Group sessions and Medication administration.  Evaluation of Outcomes: Not Met   RN Treatment Plan for Primary Diagnosis: <principal problem not specified> Long Term Goal(s): Knowledge of disease and therapeutic regimen to maintain health will improve  Short Term Goals: Ability to participate in decision making will improve, Ability to verbalize feelings will improve, Ability to disclose and discuss suicidal ideas and Ability to identify and develop effective coping behaviors will improve  Medication Management: RN will administer medications as ordered by provider, will assess and evaluate patient's response and provide education to patient for prescribed medication. RN will report any adverse and/or side effects to prescribing provider.  Therapeutic Interventions: 1 on 1 counseling sessions, Psychoeducation, Medication administration, Evaluate responses to treatment, Monitor vital signs and CBGs as ordered, Perform/monitor CIWA, COWS, AIMS and Fall Risk screenings as ordered, Perform wound care treatments as ordered.  Evaluation of Outcomes: Not Met   LCSW Treatment Plan for Primary Diagnosis: <principal problem not specified> Long Term Goal(s): Safe transition to appropriate next level of care at discharge, Engage patient in therapeutic group addressing interpersonal concerns.  Short Term Goals: Engage patient in aftercare planning with referrals and resources  Therapeutic Interventions: Assess for all discharge needs, 1 to 1 time with Social worker, Explore available resources and support systems, Assess for adequacy in community support network, Educate family and significant other(s) on suicide prevention, Complete Psychosocial Assessment, Interpersonal group therapy.  Evaluation of Outcomes: Not Met   Progress in Treatment: Attending groups:  No. Participating in groups: No. Taking medication as prescribed: Yes. Toleration medication: Yes. Family/Significant other contact made: Yes, individual(s) contacted:  the patient's probation officer  Patient understands diagnosis: Yes. Discussing patient identified problems/goals with staff: Yes. Medical problems stabilized or resolved: Yes. Denies suicidal/homicidal ideation: Yes. Issues/concerns per patient self-inventory: No. Other:   New problem(s) identified:None   New Short Term/Long Term Goal(s):  medication stabilization, elimination of SI thoughts, development of comprehensive mental wellness plan.    Patient Goals: "To get medicated and stabilized"    Discharge Plan or Barriers: Patient recently admitted. CSW will continue to follow and assess for appropriate referrals and possible discharge planning.    Reason for Continuation of Hospitalization: Depression Medication stabilization Suicidal ideation  Estimated Length of Stay: 3-5 days   Attendees: Patient: Russell Nichols 02/12/2019 8:17 AM  Physician: Dr. Neita Garnet, MD 02/12/2019 8:17 AM  Nursing: Benjamine Mola.Jenetta Downer RN 02/12/2019 8:17 AM  RN Care Manager: 02/12/2019 8:17 AM  Social Worker: Radonna Ricker, LCSW 02/12/2019 8:17 AM  Recreational Therapist:  02/12/2019 8:17 AM  Other:  02/12/2019 8:17 AM  Other:  02/12/2019 8:17 AM  Other: 02/12/2019 8:17 AM    Scribe for Treatment Team: Marylee Floras, [place] 02/12/2019 8:17 AM

## 2019-02-12 NOTE — Progress Notes (Signed)
[facility] MD Progress Note  02/12/2019 4:03 PM Russell Nichols  MRN:  [id number] Subjective: Patient reports "I am feeling all right".  Currently denies significant withdrawal symptoms, and states "I will think I would get any because I was not drinking that much".  Denies suicidal ideations.  At this time presents future oriented and focusing more on discharge/disposition.  Currently denies medication side effects.  Objective: I have reviewed case with treatment team and have met with patient. 48 year old male, presented to ED voluntarily on 9/28 reporting depression, neurovegetative symptoms and passive suicidal ideations.  He attempted to leave ED after registering and was eventually admitted under commitment.  He reports significant recent stressors which include homelessness (had been staying with a friend prior to admission but apparently cannot return there, recent death of pet dog, unemployment, recent relapse into heavy drinking.  Admission BAL 171.  Patient has a history of alcohol dependence, cannabis dependence, mood disorder/depression.  Patient is presenting alert, attentive, calm, pleasant on approach.  Currently endorses improving mood and states "I am feeling all right today".  Minimizes neurovegetative symptoms of depression at this time.  Denies suicidal ideations and presents future oriented.  He is hopeful for discharge soon. Denies medication side effects thus far. Denies symptoms of withdrawal and currently presents calm and in no acute distress.  No distal tremors or diaphoresis are noted. No agitated or disruptive behaviors on unit. Visible in day room.  Principal Problem: MDD, Alcohol /Cannabis Use Disorder Diagnosis: Active Problems:   MDD (major depressive disorder)  Total Time spent with patient: 20 minutes  Past Psychiatric History:   Past Medical History:  Past Medical History:  Diagnosis Date  . Alcoholism ([place])    No past surgical history on file. Family History:   Family History  Family history unknown: Yes   Family Psychiatric  History:  Social History:  Social History   Substance and Sexual Activity  Alcohol Use Yes   Comment: last drank just PTA     Social History   Substance and Sexual Activity  Drug Use Yes  . Types: Marijuana   Comment: last used 01/23/19    Social History   Socioeconomic History  . Marital status: Single    Spouse name: Not on file  . Number of children: Not on file  . Years of education: Not on file  . Highest education level: Not on file  Occupational History  . Not on file  Social Needs  . Financial resource strain: Not on file  . Food insecurity    Worry: Not on file    Inability: Not on file  . Transportation needs    Medical: Not on file    Non-medical: Not on file  Tobacco Use  . Smoking status: Current Every Day Smoker    Types: Cigars  . Smokeless tobacco: Former Network engineer and Sexual Activity  . Alcohol use: Yes    Comment: last drank just PTA  . Drug use: Yes    Types: Marijuana    Comment: last used 01/23/19  . Sexual activity: Not on file  Lifestyle  . Physical activity    Days per week: Not on file    Minutes per session: Not on file  . Stress: Not on file  Relationships  . Social Herbalist on phone: Not on file    Gets together: Not on file    Attends religious service: Not on file    Active member of club or  organization: Not on file    Attends meetings of clubs or organizations: Not on file    Relationship status: Not on file  Other Topics Concern  . Not on file  Social History Narrative  . Not on file   Additional Social History:   Sleep: Good  Appetite:  Good  Current Medications: Current Facility-Administered Medications  Medication Dose Route Frequency Provider Last Rate Last Dose  . escitalopram (LEXAPRO) tablet 20 mg  20 mg Oral Daily Mordecai Maes, NP   20 mg at 02/12/19 0835  . hydrOXYzine (ATARAX/VISTARIL) tablet 25 mg  25 mg Oral Q6H  PRN Cobos, Myer Peer, MD   25 mg at 02/11/19 2123  . ibuprofen (ADVIL) tablet 600 mg  600 mg Oral Q6H PRN Mordecai Maes, NP      . LORazepam (ATIVAN) tablet 1 mg  1 mg Oral Q6H PRN Cobos, Myer Peer, MD      . LORazepam (ATIVAN) tablet 1 mg  1 mg Oral TID Cobos, Myer Peer, MD   1 mg at 02/12/19 0835   Followed by  . LORazepam (ATIVAN) tablet 1 mg  1 mg Oral BID Cobos, Myer Peer, MD       Followed by  . [START ON 02/14/2019] LORazepam (ATIVAN) tablet 1 mg  1 mg Oral Daily Cobos, Fernando A, MD      . multivitamin with minerals tablet 1 tablet  1 tablet Oral Daily Cobos, Myer Peer, MD   1 tablet at 02/12/19 0835  . QUEtiapine (SEROQUEL) tablet 25 mg  25 mg Oral QHS Cobos, Myer Peer, MD   25 mg at 02/11/19 2123  . thiamine (B-1) injection 100 mg  100 mg Intramuscular Once Cobos, Myer Peer, MD      . thiamine (VITAMIN B-1) tablet 100 mg  100 mg Oral Daily Cobos, Myer Peer, MD   100 mg at 02/12/19 3785    Lab Results:  Results for orders placed or performed during the hospital encounter of 02/10/19 (from the past 48 hour(s))  Hemoglobin A1c     Status: None   Collection Time: 02/11/19  6:59 AM  Result Value Ref Range   Hgb A1c MFr Bld 5.2 4.8 - 5.6 %    Comment: (NOTE) Pre diabetes:          5.7%-6.4% Diabetes:              >6.4% Glycemic control for   <7.0% adults with diabetes    Mean Plasma Glucose 102.54 mg/dL    Comment: Performed at [facility], [place] [address] [place], [place] [address]  Lipid panel     Status: Abnormal   Collection Time: 02/11/19  6:59 AM  Result Value Ref Range   Cholesterol 265 (H) 0 - 200 mg/dL   Triglycerides 197 (H) <150 mg/dL   HDL 61 >40 mg/dL   Total CHOL/HDL Ratio 4.3 RATIO   VLDL 39 0 - 40 mg/dL   LDL Cholesterol 165 (H) 0 - 99 mg/dL    Comment:        Total Cholesterol/HDL:CHD Risk Coronary Heart Disease Risk Table                     Men   Women  1/2 Average Risk   3.4   3.3  Average Risk       5.0   4.4  2 X Average Risk    9.6   7.1  3 X Average Risk  23.4  11.0        Use the calculated Patient Ratio above and the CHD Risk Table to determine the patient's CHD Risk.        ATP III CLASSIFICATION (LDL):  <100     mg/dL   Optimal  100-129  mg/dL   Near or Above                    Optimal  130-159  mg/dL   Borderline  160-189  mg/dL   High  >190     mg/dL   Very High Performed at [place] [address], [place], [place] [address]   Prolactin     Status: Abnormal   Collection Time: 02/11/19  6:59 AM  Result Value Ref Range   Prolactin 22.9 (H) 4.0 - 15.2 ng/mL    Comment: (NOTE) Performed At: [facility] [place], [place] [id number] Rush Farmer MD [id number]   TSH     Status: None   Collection Time: 02/11/19  6:59 AM  Result Value Ref Range   TSH 1.813 0.350 - 4.500 uIU/mL    Comment: Performed by a 3rd Generation assay with a functional sensitivity of <=0.01 uIU/mL. Performed at [facility], [place] [address], [place],  [address]     Blood Alcohol level:  Lab Results  Component Value Date   ETH 171 (H) 02/08/2019   ETH 194 (H) 82/50/5397    Metabolic Disorder Labs: Lab Results  Component Value Date   HGBA1C 5.2 02/11/2019   MPG 102.54 02/11/2019   Lab Results  Component Value Date   PROLACTIN 22.9 (H) 02/11/2019   Lab Results  Component Value Date   CHOL 265 (H) 02/11/2019   TRIG 197 (H) 02/11/2019   HDL 61 02/11/2019   CHOLHDL 4.3 02/11/2019   VLDL 39 02/11/2019   LDLCALC 165 (H) 02/11/2019    Physical Findings: AIMS:  , ,  ,  ,    CIWA:  CIWA-Ar Total: 0 COWS:     Musculoskeletal: Strength & Muscle Tone: within normal limits Gait & Station: normal Patient leans: N/A  Psychiatric Specialty Exam: Physical Exam  ROS denies headache, denies chest pain, no shortness of breath, no nausea or vomiting, no tremors or diaphoresis, no fever or chills.  Blood pressure (!) 145/109, pulse 80,  temperature 98.6 F (37 C), resp. rate 18, height 6' (1.829 m), weight 87.1 kg, SpO2 100 %.Body mass index is 26.04 kg/m.  General Appearance: Improved grooming  Eye Contact:  Good  Speech:  Normal Rate  Volume:  Normal  Mood:  Improved mood, currently minimizes depression, describes mood as "all right today"  Affect:  More reactive, smiles at times appropriately during session, not irritable at this time  Thought Process:  Linear and Descriptions of Associations: Intact  Orientation:  Other:  Fully alert and attentive  Thought Content:  No hallucinations, no delusions, does not appear internally preoccupied at this time  Suicidal Thoughts:  Denies suicidal or self-injurious ideations  Homicidal Thoughts:  No  Memory:  Recent and remote grossly intact  Judgement: Improving  Insight:  Fair/improving  Psychomotor Activity:  Normal-no current psychomotor agitation or restlessness.  No tremors noted  Concentration:  Concentration: Improving and Attention Span: Improving  Recall:  Good  Fund of Knowledge:  Good  Language:  Good  Akathisia:  Negative  Handed:  Right  AIMS (if indicated):     Assets:  Desire for Improvement Resilience  ADL's:  Intact  Cognition:  WNL  Sleep:  Number of Hours: 6.25   Assessment- 48 year old male, presented to ED voluntarily on 9/28 reporting depression, neurovegetative symptoms and passive suicidal ideations.  He attempted to leave ED after registering and was eventually admitted under commitment.  He reports significant recent stressors which include homelessness (had been staying with a friend prior to admission but apparently cannot return there, recent death of pet dog, unemployment, recent relapse into heavy drinking.  Admission BAL 171.  Patient has a history of alcohol dependence, cannabis dependence, mood disorder/depression.  Today patient presents with improving mood, a full range of affect, denies any suicidal ideations and presents future  oriented, hoping for discharge soon.  He is currently not presenting with symptoms of alcohol withdrawal.  BP has trended high without headache,chest pain or other associated symptoms.  It is not felt that elevated BP is related to alcohol withdrawal as he does not have any other symptoms indicative of withdrawal and no tachycardia.  He also states he has been told his blood pressure has been elevated in the past.  Today agrees to initiate antihypertensive treatment.  We discussed options.  Agrees to lisinopril.   Treatment Plan Summary: Daily contact with patient to assess and evaluate symptoms and progress in treatment, Medication management, Plan Inpatient treatment and Medications as below Treatment team working on disposition options Encourage group/milieu participation Encourage efforts to work on sobriety and relapse prevention Continue Lexapro 20 mg daily for depression and anxiety Continue Seroquel 25 mg nightly for mood disorder and insomnia Continue Ativan detox protocol for alcohol withdrawal Start Lisinopril 10 mgrs QDAY for HTN  Jenne Campus, MD 02/12/2019, 4:03 PM

## 2019-02-12 NOTE — Progress Notes (Signed)
Recreation Therapy Notes  Date:  10.2.20 Time: 0930 Location: 300 Hall Dayroom  Group Topic: Stress Management  Goal Area(s) Addresses:  Patient will identify positive stress management techniques. Patient will identify benefits of using stress management post d/c.  Behavioral Response:  Engaged  Intervention: Stress Management  Activity : Progressive Muscle Relaxation.  LRT introduced the stress management technique of pmr.  LRT read a script that lead group in the process of tensing each muscle group individually and then relaxing them.  Education:  Stress Management, Discharge Planning.   Education Outcome: Acknowledges Education  Clinical Observations/Feedback:  Pt attended and participated in group session.    Victorino Sparrow, LRT/CTRS         Victorino Sparrow A 02/12/2019 11:52 AM

## 2019-02-12 NOTE — Progress Notes (Signed)
Pt said Thurmond Butts has all the patients on the 300 hall afraid of him. Dionisios said he called him out and asked him to stop talking he did not want to hear what he had to say. Pamela said he has asked Thurmond Butts several times today to be quiet. RN notify

## 2019-02-12 NOTE — [facility] Group Notes (Signed)
LCSW Group Therapy Note 02/12/2019 2:28 PM  Type of Therapy/Topic: Group Therapy: Feelings about Diagnosis  Participation Level: Minimal   Description of Group:  This group will allow patients to explore their thoughts and feelings about diagnoses they have received. Patients will be guided to explore their level of understanding and acceptance of these diagnoses. Facilitator will encourage patients to process their thoughts and feelings about the reactions of others to their diagnosis and will guide patients in identifying ways to discuss their diagnosis with significant others in their lives. This group will be process-oriented, with patients participating in exploration of their own experiences, giving and receiving support, and processing challenge from other group members.  Therapeutic Goals: 1. Patient will demonstrate understanding of diagnosis as evidenced by identifying two or more symptoms of the disorder 2. Patient will be able to express two feelings regarding the diagnosis 3. Patient will demonstrate their ability to communicate their needs through discussion and/or role play  Summary of Patient Progress:  Russell Nichols was engaged and participated throughout the group session. [name] reports that having a mental health diagnosis is "like polishing a brick, its just work and work, and no results".     Therapeutic Modalities:  Cognitive Behavioral Therapy Brief Therapy Feelings Identification    [place] Clinical Social Worker

## 2019-02-13 MED ORDER — HYDROXYZINE HCL 25 MG PO TABS
25.0000 mg | ORAL_TABLET | Freq: Four times a day (QID) | ORAL | Status: DC | PRN
  Administered 2019-02-13 – 2019-02-14 (×2): 25 mg via ORAL
  Filled 2019-02-13 (×2): qty 1

## 2019-02-13 MED ORDER — QUETIAPINE FUMARATE 25 MG PO TABS
75.0000 mg | ORAL_TABLET | Freq: Every day | ORAL | Status: DC
  Administered 2019-02-13 – 2019-02-14 (×2): 75 mg via ORAL
  Filled 2019-02-13 (×3): qty 3
  Filled 2019-02-13: qty 2

## 2019-02-13 NOTE — Progress Notes (Signed)
D: Pt denies SI/HI/AVH. Pt is pleasant and cooperative. Pt stated he was doing good, pt thought he was going home earlier, but pt ok due to elevated Bp and starting new medication today.  A: Pt was offered support and encouragement. Pt was given scheduled medications. Pt was encourage to attend groups. Q 15 minute checks were done for safety.  R:Pt attends groups and interacts well with peers and staff. Pt is taking medication. Pt has no complaints.Pt receptive to treatment and safety maintained on unit.

## 2019-02-13 NOTE — Plan of Care (Signed)
  Problem: Education: Goal: Emotional status will improve Outcome: Progressing Goal: Mental status will improve Outcome: Progressing   Problem: Medication: Goal: Compliance with prescribed medication regimen will improve Outcome: Progressing

## 2019-02-13 NOTE — [facility] Group Notes (Signed)
[facility] Group Notes:  (Nursing/MHT/Case Management/Adjunct)  Date:  02/13/2019  Time:  1400  Type of Therapy:  Nurse Education  Participation Level:  Active  Participation Quality:  Appropriate  Affect:  Appropriate  Cognitive:  Appropriate  Insight:  Appropriate  Engagement in Group:  Limited  Modes of Intervention:  Discussion and Education  Summary of Progress/Problems:  Marissa Calamity 02/13/2019, 2:35 PM

## 2019-02-13 NOTE — Progress Notes (Signed)
Patient ID: Russell Nichols, male   DOB: February 19, 1971, 48 y.o.   MRN: [id number]  [place] NOVEL CORONAVIRUS (COVID-19) DAILY CHECK-OFF SYMPTOMS - answer yes or no to each - every day NO YES  Have you had a fever in the past 24 hours?  . Fever (Temp > 37.80C / 100F) X   Have you had any of these symptoms in the past 24 hours? . New Cough .  Sore Throat  .  Shortness of Breath .  Difficulty Breathing .  Unexplained Body Aches   X   Have you had any one of these symptoms in the past 24 hours not related to allergies?   . Runny Nose .  Nasal Congestion .  Sneezing   X   If you have had runny nose, nasal congestion, sneezing in the past 24 hours, has it worsened?  X   EXPOSURES - check yes or no X   Have you traveled outside the state in the past 14 days?  X   Have you been in contact with someone with a confirmed diagnosis of COVID-19 or PUI in the past 14 days without wearing appropriate PPE?  X   Have you been living in the same home as a person with confirmed diagnosis of COVID-19 or a PUI (household contact)?    X   Have you been diagnosed with COVID-19?    X              What to do next: Answered NO to all: Answered YES to anything:   Proceed with unit schedule Follow the [facility] Inpatient Flowsheet.

## 2019-02-13 NOTE — Progress Notes (Addendum)
[facility] MD Progress Note  02/13/2019 11:50 AM Russell Nichols  MRN:  [id number] Subjective: Patient states "I am having a rough day today".  Reports he had nightmares last night which disrupted sleep. Describes some increased depression related to the above, although denies suicidal ideations and contracts for safety on unit.  Denies medication side effects. Objective: I have reviewed chart notes and have met with patient. 48 year old male, presented to ED voluntarily on 9/28 reporting depression, neurovegetative symptoms and passive suicidal ideations.  He attempted to leave ED after registering and was eventually admitted under commitment.  He reports significant recent stressors which include homelessness (had been staying with a friend prior to admission but apparently cannot return there, recent death of pet dog, unemployment, recent relapse into heavy drinking.  Admission BAL 171.  Patient has a history of alcohol dependence, cannabis dependence, mood disorder/depression.  Overall patient has reported improvement compared to admission.  Yesterday had stated he was feeling noticeably better and was focused on discharge soon.  Today patient reports he is feeling more depressed and vaguely anxious, particularly after disturbing nightmare last night.  States "I think it might be a good idea if I stay in the hospital today".  He denies suicidal ideations.  Denies hallucinations symptoms and does not appear internally preoccupied. No disruptive or agitated behaviors on unit Presents alert, attentive, calm, polite, vaguely anxious today.  Affect tends to improve during session. Thus far tolerating medications well.  He is interested in titrating Seroquel dose up.  States that in the past he has been on 75 mg nightly with good results/better sleep/improved mood.  States that doses above 75 mg "make me twitch sometimes".    Principal Problem: MDD, Alcohol /Cannabis Use Disorder Diagnosis: Active Problems:  MDD (major depressive disorder)  Total Time spent with patient: 15 minutes  Past Psychiatric History:   Past Medical History:  Past Medical History:  Diagnosis Date  . Alcoholism ([place])    No past surgical history on file. Family History:  Family History  Family history unknown: Yes   Family Psychiatric  History:  Social History:  Social History   Substance and Sexual Activity  Alcohol Use Yes   Comment: last drank just PTA     Social History   Substance and Sexual Activity  Drug Use Yes  . Types: Marijuana   Comment: last used 01/23/19    Social History   Socioeconomic History  . Marital status: Single    Spouse name: Not on file  . Number of children: Not on file  . Years of education: Not on file  . Highest education level: Not on file  Occupational History  . Not on file  Social Needs  . Financial resource strain: Not on file  . Food insecurity    Worry: Not on file    Inability: Not on file  . Transportation needs    Medical: Not on file    Non-medical: Not on file  Tobacco Use  . Smoking status: Current Every Day Smoker    Types: Cigars  . Smokeless tobacco: Former Network engineer and Sexual Activity  . Alcohol use: Yes    Comment: last drank just PTA  . Drug use: Yes    Types: Marijuana    Comment: last used 01/23/19  . Sexual activity: Not on file  Lifestyle  . Physical activity    Days per week: Not on file    Minutes per session: Not on file  . Stress: Not  on file  Relationships  . Social connections    Talks on phone: Not on file    Gets together: Not on file    Attends religious service: Not on file    Active member of club or organization: Not on file    Attends meetings of clubs or organizations: Not on file    Relationship status: Not on file  Other Topics Concern  . Not on file  Social History Narrative  . Not on file   Additional Social History:   Sleep: Fair  Appetite:  Good  Current Medications: Current  Facility-Administered Medications  Medication Dose Route Frequency Provider Last Rate Last Dose  . escitalopram (LEXAPRO) tablet 20 mg  20 mg Oral Daily Thomas, Lashunda, NP   20 mg at 02/13/19 0832  . hydrOXYzine (ATARAX/VISTARIL) tablet 25 mg  25 mg Oral Q6H PRN ,  A, MD   25 mg at 02/12/19 2210  . lisinopril (ZESTRIL) tablet 10 mg  10 mg Oral Daily ,  A, MD   10 mg at 02/13/19 0832  . LORazepam (ATIVAN) tablet 1 mg  1 mg Oral Q6H PRN ,  A, MD      . [START ON 02/14/2019] LORazepam (ATIVAN) tablet 1 mg  1 mg Oral Daily ,  A, MD      . multivitamin with minerals tablet 1 tablet  1 tablet Oral Daily ,  A, MD   1 tablet at 02/13/19 0832  . QUEtiapine (SEROQUEL) tablet 75 mg  75 mg Oral QHS ,  A, MD      . thiamine (B-1) injection 100 mg  100 mg Intramuscular Once ,  A, MD      . thiamine (VITAMIN B-1) tablet 100 mg  100 mg Oral Daily ,  A, MD   100 mg at 02/13/19 0833    Lab Results:  No results found for this or any previous visit (from the past 48 hour(s)).  Blood Alcohol level:  Lab Results  Component Value Date   ETH 171 (H) 02/08/2019   ETH 194 (H) 11/21/2017    Metabolic Disorder Labs: Lab Results  Component Value Date   HGBA1C 5.2 02/11/2019   MPG 102.54 02/11/2019   Lab Results  Component Value Date   PROLACTIN 22.9 (H) 02/11/2019   Lab Results  Component Value Date   CHOL 265 (H) 02/11/2019   TRIG 197 (H) 02/11/2019   HDL 61 02/11/2019   CHOLHDL 4.3 02/11/2019   VLDL 39 02/11/2019   LDLCALC 165 (H) 02/11/2019    Physical Findings: AIMS:  , ,  ,  ,    CIWA:  CIWA-Ar Total: 0 COWS:     Musculoskeletal: Strength & Muscle Tone: within normal limits Gait & Station: normal Patient leans: N/A  Psychiatric Specialty Exam: Physical Exam  ROS denies headache, denies chest pain, no shortness of breath, no nausea or vomiting, no tremors or diaphoresis, no fever or  chills.  Blood pressure (!) 120/99, pulse 89, temperature 97.8 F (36.6 C), temperature source Oral, resp. rate 18, height 6' (1.829 m), weight 87.1 kg, SpO2 100 %.Body mass index is 26.04 kg/m.  General Appearance: Improved grooming  Eye Contact:  Good  Speech:  Normal Rate  Volume:  Normal  Mood:  Overall has improved compared to admission but today reports some increased depression and anxiety  Affect:  Vaguely anxious, constricted.  Improves during session, smiles at times appropriately during session  Thought Process:  Linear and Descriptions of Associations:   Intact  Orientation:  Other:  Fully alert and attentive  Thought Content:  No hallucinations, no delusions, does not appear internally preoccupied at this time  Suicidal Thoughts:  Denies suicidal or self-injurious ideations  Homicidal Thoughts:  No  Memory:  Recent and remote grossly intact  Judgement: Improving  Insight:  Fair/improving  Psychomotor Activity:  Normal-no current psychomotor agitation or restlessness.  No tremors noted  Concentration:  Concentration: Improving and Attention Span: Improving  Recall:  Good  Fund of Knowledge:  Good  Language:  Good  Akathisia:  Negative  Handed:  Right  AIMS (if indicated):     Assets:  Desire for Improvement Resilience  ADL's:  Intact  Cognition:  WNL  Sleep:  Number of Hours: 2.5   Assessment- 48-year-old male, presented to ED voluntarily on 9/28 reporting depression, neurovegetative symptoms and passive suicidal ideations.  He attempted to leave ED after registering and was eventually admitted under commitment.  He reports significant recent stressors which include homelessness (had been staying with a friend prior to admission but apparently cannot return there, recent death of pet dog, unemployment, recent relapse into heavy drinking.  Admission BAL 171.  Patient has a history of alcohol dependence, cannabis dependence, mood disorder/depression.  Patient reports some  increased anxiety and feeling vaguely depressed today, although denies any suicidal ideations and contracts for safety.  Attributes increased symptoms in part to poor sleep last night and having had disturbing nightmares.  Today expresses feeling that he is still benefiting from inpatient care.  Currently tolerating Lexapro and Seroquel well.  Will continue to titrate Seroquel as tolerated.  Of note, BP improved on lisinopril-125/93 today.   Treatment Plan Summary: Daily contact with patient to assess and evaluate symptoms and progress in treatment, Medication management, Plan Inpatient treatment and Medications as below  Treatment plan reviewed as below today 10/3 Treatment team working on disposition options Encourage group/milieu participation Encourage efforts to work on sobriety and relapse prevention Continue Lexapro 20 mg daily for depression and anxiety Increase Seroquel to 75  mg nightly for mood disorder and insomnia Continue Ativan detox protocol for alcohol withdrawal Continue  Lisinopril 10 mgrs QDAY for HTN  Fernando A Cobos, MD 02/13/2019, 11:50 AM   Patient ID: Russell Nichols, male   DOB: 11/30/1970, 48 y.o.   MRN: [id number]  

## 2019-02-13 NOTE — [facility] Group Notes (Signed)
LCSW Group Therapy Note  02/13/2019    10:00-11:00am   Type of Therapy and Topic:  Group Therapy: Shame and Its Impact   Participation Level:  Active   Description of Group:   In this group, patients shared and discussed that guilt is the negative feeling we have when we've done something wrong, while shame is the negative feeling we have simply about "being."  In listening to each other share, patients learned that humans are all imperfect and that there is no shame in this.  We discussed how it could positively impact our wellbeing by accepting our faults as part of our being that can be worked on but does not have to shame [place].    Therapeutic Goals: 1. Patients will learn the difference between guilt and shame. 2. Patients will share their current shame feelings and how this has impacted their current lives. 3. Patients will explore possible ways to think differently about those parts of their bodies, feelings, and lives about which they do have shame. 4. Patients will learn that shame is universal, and that keeping our shame a secret actually increases its hold on [place].  Summary of Patient Progress:  The patient shared frequently throughout group.  He had good insight and helped others to develop insight with a couple of stories.  He gave an example at the beginning of group about stealing a toy as a child, and how he felt guilt and shame, then his father taught him to accept the guilt and change his behavior, but not to be ashamed.  Therapeutic Modalities:   Cognitive Behavioral Therapy Motivation Interviewing  Maretta Los  .

## 2019-02-14 MED ORDER — ACETAMINOPHEN 325 MG PO TABS
650.0000 mg | ORAL_TABLET | Freq: Four times a day (QID) | ORAL | Status: DC | PRN
  Administered 2019-02-14 – 2019-02-15 (×2): 650 mg via ORAL
  Filled 2019-02-14 (×2): qty 2

## 2019-02-14 MED ORDER — ESCITALOPRAM OXALATE 20 MG PO TABS: 20.0000 mg | ORAL_TABLET | Freq: Every day | ORAL | 0 refills | Status: DC

## 2019-02-14 MED ORDER — LISINOPRIL 10 MG PO TABS: 10.0000 mg | ORAL_TABLET | Freq: Every day | ORAL | 0 refills | Status: DC

## 2019-02-14 MED ORDER — CLONIDINE HCL 0.1 MG PO TABS
0.1000 mg | ORAL_TABLET | Freq: Three times a day (TID) | ORAL | Status: DC | PRN
  Administered 2019-02-14: 0.1 mg via ORAL
  Filled 2019-02-14: qty 1

## 2019-02-14 MED ORDER — QUETIAPINE FUMARATE 25 MG PO TABS: 75.0000 mg | ORAL_TABLET | Freq: Every day | ORAL | 0 refills | Status: DC

## 2019-02-14 MED ORDER — ADULT MULTIVITAMIN W/MINERALS CH: 1.0000 | ORAL_TABLET | Freq: Every day | ORAL | Status: AC

## 2019-02-14 NOTE — Progress Notes (Signed)
Patient has been up in the dayroom watching a movie and interacting with select peers. He did report hip pain but declined pain medication reporting that he tries to tolerate pain.He reports that he fell prior to his admission with about 80 pounds in his hand landing on his right hip. He plans to get it checked out once discharge. Support given and safety maintained on unit with 15 min checks.

## 2019-02-14 NOTE — Progress Notes (Signed)
[facility] MD Progress Note  02/14/2019 11:04 AM Lamorris Knoblock  MRN:  [id number] Subjective: Patient reports improvement compared to yesterday.  Yesterday had reported having a "rough day" after having had disturbing dreams/nightmares.  Today he is feeling better and focusing more on discharge.  He is currently homeless , which is his major stressor.  He ruminates about potential discharge planning. Denies suicidal ideations. Denies medication side effects.  Objective: I have reviewed chart notes and have met with patient. 48 year old male, presented to ED voluntarily on 9/28 reporting depression, neurovegetative symptoms and passive suicidal ideations.  He attempted to leave ED after registering and was eventually admitted under commitment.  He reports significant recent stressors which include homelessness (had been staying with a friend prior to admission but apparently cannot return there, recent death of pet dog, unemployment, recent relapse into heavy drinking.  Admission BAL 171.  Patient has a history of alcohol dependence, cannabis dependence, mood disorder/depression.  Today patient presents with improving mood and a more reactive affect.  He does acknowledge he feels better than yesterday.  Currently denies suicidal ideations and presents future oriented, focusing more on discharge options.  Homelessness is his major stressor.  He states he is working on trying to find a place to stay.  No disruptive or agitated behaviors on unit.  Tolerating medications well at this time.    Principal Problem: MDD, Alcohol /Cannabis Use Disorder Diagnosis: Active Problems:   MDD (major depressive disorder)  Total Time spent with patient: 15 minutes  Past Psychiatric History:   Past Medical History:  Past Medical History:  Diagnosis Date  . Alcoholism ([place])    No past surgical history on file. Family History:  Family History  Family history unknown: Yes   Family Psychiatric  History:  Social  History:  Social History   Substance and Sexual Activity  Alcohol Use Yes   Comment: last drank just PTA     Social History   Substance and Sexual Activity  Drug Use Yes  . Types: Marijuana   Comment: last used 01/23/19    Social History   Socioeconomic History  . Marital status: Single    Spouse name: Not on file  . Number of children: Not on file  . Years of education: Not on file  . Highest education level: Not on file  Occupational History  . Not on file  Social Needs  . Financial resource strain: Not on file  . Food insecurity    Worry: Not on file    Inability: Not on file  . Transportation needs    Medical: Not on file    Non-medical: Not on file  Tobacco Use  . Smoking status: Current Every Day Smoker    Types: Cigars  . Smokeless tobacco: Former Network engineer and Sexual Activity  . Alcohol use: Yes    Comment: last drank just PTA  . Drug use: Yes    Types: Marijuana    Comment: last used 01/23/19  . Sexual activity: Not on file  Lifestyle  . Physical activity    Days per week: Not on file    Minutes per session: Not on file  . Stress: Not on file  Relationships  . Social Herbalist on phone: Not on file    Gets together: Not on file    Attends religious service: Not on file    Active member of club or organization: Not on file    Attends meetings of clubs  or organizations: Not on file    Relationship status: Not on file  Other Topics Concern  . Not on file  Social History Narrative  . Not on file   Additional Social History:   Sleep: Fair  Appetite:  Good  Current Medications: Current Facility-Administered Medications  Medication Dose Route Frequency Provider Last Rate Last Dose  . acetaminophen (TYLENOL) tablet 650 mg  650 mg Oral Q6H PRN Connye Burkitt, NP      . escitalopram (LEXAPRO) tablet 20 mg  20 mg Oral Daily Mordecai Maes, NP   20 mg at 02/14/19 0918  . hydrOXYzine (ATARAX/VISTARIL) tablet 25 mg  25 mg Oral Q6H  PRN Lindon Romp A, NP   25 mg at 02/13/19 2158  . lisinopril (ZESTRIL) tablet 10 mg  10 mg Oral Daily Keni Wafer, Myer Peer, MD   10 mg at 02/14/19 0919  . multivitamin with minerals tablet 1 tablet  1 tablet Oral Daily Florence Antonelli, Myer Peer, MD   1 tablet at 02/14/19 0919  . QUEtiapine (SEROQUEL) tablet 75 mg  75 mg Oral QHS Maicey Barrientez, Myer Peer, MD   75 mg at 02/13/19 2159  . thiamine (B-1) injection 100 mg  100 mg Intramuscular Once Rielly Brunn A, MD      . thiamine (VITAMIN B-1) tablet 100 mg  100 mg Oral Daily Machelle Raybon, Myer Peer, MD   100 mg at 02/14/19 0920    Lab Results:  No results found for this or any previous visit (from the past 67 hour(s)).  Blood Alcohol level:  Lab Results  Component Value Date   ETH 171 (H) 02/08/2019   ETH 194 (H) 19/50/9326    Metabolic Disorder Labs: Lab Results  Component Value Date   HGBA1C 5.2 02/11/2019   MPG 102.54 02/11/2019   Lab Results  Component Value Date   PROLACTIN 22.9 (H) 02/11/2019   Lab Results  Component Value Date   CHOL 265 (H) 02/11/2019   TRIG 197 (H) 02/11/2019   HDL 61 02/11/2019   CHOLHDL 4.3 02/11/2019   VLDL 39 02/11/2019   LDLCALC 165 (H) 02/11/2019    Physical Findings: AIMS:  , ,  ,  ,    CIWA:  CIWA-Ar Total: 0 COWS:     Musculoskeletal: Strength & Muscle Tone: within normal limits Gait & Station: normal Patient leans: N/A  Psychiatric Specialty Exam: Physical Exam  ROS no headache, no chest pain, no shortness of breath, no cough, no vomiting, reports some hip pain  Blood pressure (!) 130/104, pulse 96, temperature 97.8 F (36.6 C), temperature source Oral, resp. rate 18, height 6' (1.829 m), weight 87.1 kg, SpO2 100 %.Body mass index is 26.04 kg/m.  General Appearance: Improved grooming  Eye Contact:  Good  Speech:  Normal Rate  Volume:  Normal  Mood:  Gradually improving compared to admission  Affect:  More reactive today, remains anxious  Thought Process:  Linear and Descriptions of  Associations: Intact  Orientation:  Other:  Fully alert and attentive  Thought Content:  No hallucinations, no delusions, does not appear internally preoccupied at this time  Suicidal Thoughts:  Denies suicidal or self-injurious ideations  Homicidal Thoughts:  No  Memory:  Recent and remote grossly intact  Judgement: Improving  Insight:  Fair/improving  Psychomotor Activity:  Normal-no current psychomotor agitation or restlessness.  No tremors noted  Concentration:  Concentration: Improving and Attention Span: Improving  Recall:  Good  Fund of Knowledge:  Good  Language:  Good  Akathisia:  Negative  Handed:  Right  AIMS (if indicated):     Assets:  Desire for Improvement Resilience  ADL's:  Intact  Cognition:  WNL  Sleep:  Number of Hours: 4.5   Assessment- 49 year old male, presented to ED voluntarily on 9/28 reporting depression, neurovegetative symptoms and passive suicidal ideations.  He attempted to leave ED after registering and was eventually admitted under commitment.  He reports significant recent stressors which include homelessness (had been staying with a friend prior to admission but apparently cannot return there, recent death of pet dog, unemployment, recent relapse into heavy drinking.  Admission BAL 171.  Patient has a history of alcohol dependence, cannabis dependence, mood disorder/depression.  Patient is presenting with improving mood and a more reactive affect.  He presents future oriented and states he is optimistic he will be able to get a job in the [employer] area in the near future.  States he slept better last night and does not endorse further significant nightmares or disruptive sleep.  Currently tolerating medications well (Lexapro, Seroquel) BP has tended to improve on lisinopril- today 119/94.  We are currently focusing on discharge planning.  Patient is currently homeless and describes limited support network.  He is reporting he is actively working on  making phone calls/reaching out to see where he can stay.     Treatment Plan Summary: Daily contact with patient to assess and evaluate symptoms and progress in treatment, Medication management, Plan Inpatient treatment and Medications as below  Treatment plan reviewed as below today 10/4 Disposition planning in progress . Encourage group/milieu participation Encourage efforts to work on sobriety and relapse prevention Continue Lexapro 20 mg daily for depression and anxiety Continue Seroquel to 75  mg nightly for mood disorder and insomnia Continue Ativan detox protocol for alcohol withdrawal Continue  Lisinopril 10 mgrs QDAY for HTN  Jenne Campus, MD 02/14/2019, 11:04 AM   Patient ID: Rozanna Boer, male   DOB: 02-Dec-1970, 48 y.o.   MRN: [id number]

## 2019-02-14 NOTE — Progress Notes (Addendum)
Pt reports decreased depression and anxiety today. Pt denies SI/HI. Pt verbalized readiness for discharge today. Pt expressed although he's ready for discharge he needs more time to prepare his discharge plans and make sure his support person will be available when he discharges.   Orders reviewed. Verbal support provided. V/s reviewed. Pt encouraged to attend groups. 15 minute checks performed for safety.  Pt compliant with tx plan. No side effects to medications verbalized by pt.

## 2019-02-14 NOTE — Progress Notes (Signed)
Dr. Parke Poisson called tonight and would like the pt's b/p assessed before administering Seroquel at bedtime.

## 2019-02-14 NOTE — Progress Notes (Signed)
Patient observed up in the dayroom talking with peers. Writer spoke with him about his scheduled medications which he was pleased knowing his Seroquel dosage had increased as he suggested. He did request a vistaril to take with his Seroquel. He has been pleasant but appears sad. Support given and safety maintained with 15 min checks.

## 2019-02-14 NOTE — Plan of Care (Signed)
  Problem: Education: Goal: Knowledge of [facility] Education information/materials will improve Outcome: Progressing Goal: Verbalization of understanding the information provided will improve Outcome: Progressing   Problem: Medication: Goal: Compliance with prescribed medication regimen will improve Outcome: Progressing   Problem: Self-Concept: Goal: Ability to disclose and discuss suicidal ideas will improve Outcome: Progressing Goal: Will verbalize positive feelings about self Outcome: Progressing

## 2019-02-14 NOTE — [facility] Group Notes (Signed)
[facility] LCSW Group Therapy Note  02/14/2019  10:00-11:00AM  Type of Therapy and Topic:  Group Therapy:  Adding Supports Including Yourself  Participation Level:  Active   Description of Group:  Patients in this group were introduced to the concept that additional supports including self-support are an essential part of recovery.  Patients listed what supports they believe they need to add to their lives to achieve their goals at discharge, and they listed such things as therapist, family, doctor, support groups, and more.   A song entitled "My Own Hero" was played and a group discussion ensued in which patients stated they could relate to the song and it inspired them to realize they have be willing to help themselves in order to succeed, because other people cannot achieve their goals such as sobriety or stability for them.  A song was played called "I Am Enough" which led to a discussion about being willing to believe we are worth the effort of being a self-support.   Therapeutic Goals: 1)  demonstrate the importance of being a key part of one's own support system 2)  discuss various available supports 3)  encourage patient to use music as part of their self-support and focus on goals 4)  elicit ideas from patients about supports that need to be added   Summary of Patient Progress:  The patient expressed that healthy supports for him have to have lived through his experience successfully before he feels they can help him.  He said this includes his current healthy supports of Education officer, museum, psychiatrist, therapist, sponsor, and 12-step group.  Unhealthy for him are church people and family members because he only wants to manipulate them.    Therapeutic Modalities:   Motivational Interviewing Activity  Maretta Los

## 2019-02-15 NOTE — Progress Notes (Signed)
Recreation Therapy Notes  Date:  10.5.20 Time: 0930 Location: [address] Dayroom  Group Topic: Stress Management  Goal Area(s) Addresses:  Patient will identify positive stress management techniques. Patient will identify benefits of using stress management post d/c.  Behavioral Response: Engaged  Intervention: Stress Management  Activity : Music.  Patients were given the opportunity to request songs that were soothing, peaceful and feel good.  Songs were to be appropriate meaning no cursing, not sexually explicit or any language about drug/alcohol use.   Education:  Stress Management, Discharge Planning.   Education Outcome: Acknowledges Education  Clinical Observations/Feedback: Pt attended and participated in activity.     Victorino Sparrow, LRT/CTRS         Victorino Sparrow A 02/15/2019 10:39 AM

## 2019-02-15 NOTE — [facility] Suicide Risk Assessment (Addendum)
[facility] Discharge Suicide Risk Assessment   Principal Problem:Depression Discharge Diagnoses: Active Problems:   MDD (major depressive disorder)   Total Time spent with patient: 30 minutes  Musculoskeletal: Strength & Muscle Tone: within normal limits Gait & Station: normal Patient leans: N/A  Psychiatric Specialty Exam: ROS reports improved hip pain today and is ambulating well, without limping at this time, no chest pain, no shortness of breath, no vomiting  Blood pressure 103/86, pulse 90, temperature (!) 97.4 F (36.3 C), temperature source Oral, resp. rate 18, height 6' (1.829 m), weight 87.1 kg, SpO2 98 %.Body mass index is 26.04 kg/m.  General Appearance: Well Groomed  Eye Contact::  Good  Speech:  Normal Rate409  Volume:  Normal  Mood:  improved, and reports " my mood is OK today"  Affect:  Appropriate and reactive  Thought Process:  Linear and Descriptions of Associations: Intact  Orientation:  Full (Time, Place, and Person)  Thought Content:  no hallucinations,no delusions , not internally preoccupied   Suicidal Thoughts:  No denies suicidal or self injurious ideations, denies homicidal or violent ideations  Homicidal Thoughts:  No  Memory:  recent and remote grossly intact   Judgement:  Other:  improving   Insight:  improving   Psychomotor Activity:  Normal  Concentration:  Good  Recall:  Good  Fund of Knowledge:Good  Language: Good  Akathisia:  Negative  Handed:  Right  AIMS (if indicated):     Assets:  Communication Skills Desire for Improvement Resilience  Sleep:  Number of Hours: 6.25  Cognition: WNL  ADL's:  Intact   Mental Status Per Nursing Assessment::   On Admission:     Demographic Factors:  Single, no children, had been living with roommates prior to admission  Loss Factors: Unstable housing ,death of pet dog, unemployment, alcohol abuse    Historical Factors: History of past psychiatric admissions, depression,  history of alcohol use  disorder.  Risk Reduction Factors:   Positive coping skills or problem solving skills  Continued Clinical Symptoms:  Currently patient presents alert, attentive, well groomed, calm. Mood is improved and today describes feeling " okay", denies feeling depressed. Affect appropriate and reactive/ brighter , no thought disorder, no suicidal or homicidal ideations, no violent ideations, future oriented . Plans to go to an [facility] setting at discharge, and is looking forward to starting a job he has been offered in [employer] area. Behavior on unit in good control, pleasant on approach. BP improved today ( 103/86) . Tolerating Lisinopril well . Medication side effects reviewed, including risk of motor , metabolic , sedating side effects on Seroquel.  Denies medication side effects. Behavior on unit calm and in good control, pleasant on approach.  Cognitive Features That Contribute To Risk:  No gross cognitive deficits noted upon discharge. Is alert , attentive, and oriented x 3   Suicide Risk:  Mild:  Suicidal ideation of limited frequency, intensity, duration, and specificity.  There are no identifiable plans, no associated intent, mild dysphoria and related symptoms, good self-control (both objective and subjective assessment), few other risk factors, and identifiable protective factors, including available and accessible social support.  Follow-up Information    [facility], [facility] [place] Follow up on 02/18/2019.   Why: Hospital discharge appointment is Thursday, 10/8 at 1:00p.  Please bring all current medications.  Contact information: [address] [place]  [address] [phone]           Plan Of Care/Follow-up recommendations:  Activity:  as tolerated Diet:  Heart Healthy Tests:  NA Other:  See below  Patient is expressing readiness for discharge and is leaving unit in good spirits, plans to work on getting into an [employer] and to start new job he has been  offered. States a friend will be picking him up later today. Follow up as above . Also recommended to follow up at [facility] for medical management as needed   Craige Cotta, MD 02/15/2019, 9:30 AM

## 2019-02-15 NOTE — Progress Notes (Signed)
Patient ID: Russell Nichols, male   DOB: 1970/12/20, 48 y.o.   MRN: [id number]   Patient discharged to home/self care in the presence of family.  Patient denies SI, HI and AVH upon discharge.  Patient was eager to discharge and stated to that he would engage in follow up care in the community.  Patient acknowledged understanding of all discharge instructions and receipt of personal belongings.

## 2019-02-15 NOTE — Progress Notes (Signed)
Patient's PO requested for staff to notify him when the patient was scheduled to discharge from the hospital. Patient was cleared for discharge today, 02/15/2019.   Patient signed a "Release of Information" for his probation officer, Officer Mock ([phone]) to inform him of the patient's discharge. CSW attempted to speak with Officer Mock, however there was no answer. CSW left a HIPAA compliant voicemail.   There were no further concerns or questions at this time.   Radonna Ricker, MSW, [place] Worker [facility]  Phone: [phone]

## 2019-02-15 NOTE — Progress Notes (Signed)
  [facility] Adult Case Management Discharge Plan :  Will you be returning to the same living situation after discharge:  No. Patient reports he is discharging home with his friend.  At discharge, do you have transportation home?: Yes,  patient reports his friend is picking him up Do you have the ability to pay for your medications: No.  Release of information consent forms completed and in the chart;  Patient's signature needed at discharge.  Patient to Follow up at: Follow-up Information    [facility], [facility] Follow up on 02/18/2019.   Why: Hospital discharge appointment is Thursday, 10/8 at 1:00p.  Please bring all current medications.  Contact information: [address] [place] [address] [phone]           Next level of care provider has access to [place] and Suicide Prevention discussed: Yes,  with the patient     Has patient been referred to the Quitline?: Patient refused referral  Patient has been referred for addiction treatment: N/A  Marylee Floras, LCSWA 02/15/2019, 10:11 AM
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
## 2019-02-15 NOTE — Progress Notes (Signed)
Adult Psychoeducational Group Note  Date:  02/15/2019 Time:  3:55 AM  Group Topic/Focus:  Wrap-Up Group:   The focus of this group is to help patients review their daily goal of treatment and discuss progress on daily workbooks.  Participation Level:  Active  Participation Quality:  Appropriate  Affect:  Appropriate  Cognitive:  Appropriate  Insight: Appropriate  Engagement in Group:  Engaged  Modes of Intervention:  Discussion  Additional Comments:  Pt said his day was a 65. His goal for today was to socialize more.  Lenice Llamas Long 02/15/2019, 3:55 AM

## 2019-02-16 NOTE — Progress Notes (Signed)
Spiritual care group on grief and loss facilitated by chaplain Jerene Pitch  Group Goal:  Support / Education around grief and loss Members engage in facilitated group support and psycho-social education.  Group Description:  Following introductions and group rules, group members engaged in facilitated group dialog and support around topic of loss, with particular support around experiences of loss in their lives. Group Identified types of loss (relationships / self / things) and identified patterns, circumstances, and changes that precipitate losses. Reflected on thoughts / feelings around loss, normalized grief responses, and recognized variety in grief experience. Patient Progress:  Russell Nichols was present at beginning of group.  Left group after 20 minutes in order to prepare for discharge.

## 2019-02-27 ENCOUNTER — Encounter (HOSPITAL_BASED_OUTPATIENT_CLINIC_OR_DEPARTMENT_OTHER): Payer: Self-pay

## 2019-02-27 ENCOUNTER — Emergency Department (HOSPITAL_BASED_OUTPATIENT_CLINIC_OR_DEPARTMENT_OTHER): Payer: Self-pay

## 2019-02-27 ENCOUNTER — Emergency Department (HOSPITAL_BASED_OUTPATIENT_CLINIC_OR_DEPARTMENT_OTHER)
Admission: EM | Admit: 2019-02-27 | Discharge: 2019-02-28 | Disposition: A | Discharge status: Other Acute Inpatient Hospital | Payer: Self-pay | Attending: Emergency Medicine | Admitting: Emergency Medicine

## 2019-02-27 ENCOUNTER — Other Ambulatory Visit: Payer: Self-pay

## 2019-02-27 DIAGNOSIS — F10229 Alcohol dependence with intoxication, unspecified: Secondary | ICD-10-CM | POA: Insufficient documentation

## 2019-02-27 DIAGNOSIS — F331 Major depressive disorder, recurrent, moderate: Secondary | ICD-10-CM | POA: Insufficient documentation

## 2019-02-27 DIAGNOSIS — F323 Major depressive disorder, single episode, severe with psychotic features: Secondary | ICD-10-CM

## 2019-02-27 DIAGNOSIS — Z20828 Contact with and (suspected) exposure to other viral communicable diseases: Secondary | ICD-10-CM | POA: Insufficient documentation

## 2019-02-27 DIAGNOSIS — F10920 Alcohol use, unspecified with intoxication, uncomplicated: Secondary | ICD-10-CM

## 2019-02-27 DIAGNOSIS — I10 Essential (primary) hypertension: Secondary | ICD-10-CM | POA: Insufficient documentation

## 2019-02-27 DIAGNOSIS — R45851 Suicidal ideations: Secondary | ICD-10-CM | POA: Insufficient documentation

## 2019-02-27 DIAGNOSIS — Y908 Blood alcohol level of 240 mg/100 ml or more: Secondary | ICD-10-CM | POA: Insufficient documentation

## 2019-02-27 DIAGNOSIS — F17290 Nicotine dependence, other tobacco product, uncomplicated: Secondary | ICD-10-CM | POA: Insufficient documentation

## 2019-02-27 HISTORY — DX: Post-traumatic stress disorder, unspecified: F43.10

## 2019-02-27 HISTORY — DX: Essential (primary) hypertension: I10

## 2019-02-27 HISTORY — DX: Major depressive disorder, single episode, unspecified: F32.9

## 2019-02-27 LAB — CBC WITH DIFFERENTIAL/PLATELET
Abs Immature Granulocytes: 0.07 10*3/uL (ref 0.00–0.07)
Basophils Absolute: 0.1 10*3/uL (ref 0.0–0.1)
Basophils Relative: 1 %
Eosinophils Absolute: 0.1 10*3/uL (ref 0.0–0.5)
Eosinophils Relative: 1 %
HCT: 48 % (ref 39.0–52.0)
Hemoglobin: 16.3 g/dL (ref 13.0–17.0)
Immature Granulocytes: 1 %
Lymphocytes Relative: 20 %
Lymphs Abs: 2.1 10*3/uL (ref 0.7–4.0)
MCH: 30.9 pg (ref 26.0–34.0)
MCHC: 34 g/dL (ref 30.0–36.0)
MCV: 91.1 fL (ref 80.0–100.0)
Monocytes Absolute: 0.7 10*3/uL (ref 0.1–1.0)
Monocytes Relative: 6 %
Neutro Abs: 7.7 10*3/uL (ref 1.7–7.7)
Neutrophils Relative %: 71 %
Platelets: 330 10*3/uL (ref 150–400)
RBC: 5.27 MIL/uL (ref 4.22–5.81)
RDW: 13.8 % (ref 11.5–15.5)
WBC: 10.6 10*3/uL — ABNORMAL HIGH (ref 4.0–10.5)
nRBC: 0 % (ref 0.0–0.2)

## 2019-02-27 LAB — COMPREHENSIVE METABOLIC PANEL
ALT: 33 U/L (ref 0–44)
AST: 24 U/L (ref 15–41)
Albumin: 4.6 g/dL (ref 3.5–5.0)
Alkaline Phosphatase: 60 U/L (ref 38–126)
Anion gap: 18 — ABNORMAL HIGH (ref 5–15)
BUN: 15 mg/dL (ref 6–20)
CO2: 18 mmol/L — ABNORMAL LOW (ref 22–32)
Calcium: 9.4 mg/dL (ref 8.9–10.3)
Chloride: 102 mmol/L (ref 98–111)
Creatinine, Ser: 0.89 mg/dL (ref 0.61–1.24)
GFR calc Af Amer: >60 mL/min (ref >60)
GFR calc non Af Amer: >60 mL/min (ref >60)
Glucose, Bld: 113 mg/dL — ABNORMAL HIGH (ref 70–99)
Potassium: 3.8 mmol/L (ref 3.5–5.1)
Sodium: 138 mmol/L (ref 135–145)
Total Bilirubin: 0.9 mg/dL (ref 0.3–1.2)
Total Protein: 7.9 g/dL (ref 6.5–8.1)

## 2019-02-27 LAB — SALICYLATE LEVEL: Salicylate Lvl: <7 mg/dL (ref 2.8–30.0)

## 2019-02-27 LAB — SARS CORONAVIRUS 2 BY RT PCR (HOSPITAL ORDER, PERFORMED IN ~~LOC~~ HOSPITAL LAB): SARS Coronavirus 2: NEGATIVE

## 2019-02-27 LAB — RAPID URINE DRUG SCREEN, HOSP PERFORMED
Amphetamines: NOT DETECTED
Barbiturates: NOT DETECTED
Benzodiazepines: NOT DETECTED
Cocaine: NOT DETECTED
Opiates: NOT DETECTED
Tetrahydrocannabinol: POSITIVE — AB

## 2019-02-27 LAB — ACETAMINOPHEN LEVEL: Acetaminophen (Tylenol), Serum: <10 ug/mL — ABNORMAL LOW (ref 10–30)

## 2019-02-27 LAB — ETHANOL: Alcohol, Ethyl (B): 263 mg/dL — ABNORMAL HIGH (ref <10)

## 2019-02-27 MED ORDER — ONDANSETRON HCL 8 MG PO TABS: 4.0000 mg | ORAL_TABLET | Freq: Three times a day (TID) | ORAL | Status: DC | PRN

## 2019-02-27 MED ORDER — THIAMINE HCL 100 MG/ML IJ SOLN: 100.0000 mg | Freq: Every day | INTRAMUSCULAR | Status: DC

## 2019-02-27 MED ORDER — LORAZEPAM 2 MG/ML IJ SOLN: 0.0000 mg | Freq: Four times a day (QID) | INTRAMUSCULAR | Status: DC

## 2019-02-27 MED ORDER — NICOTINE 21 MG/24HR TD PT24: 21.0000 mg | MEDICATED_PATCH | Freq: Every day | TRANSDERMAL | Status: DC

## 2019-02-27 MED ORDER — QUETIAPINE FUMARATE 25 MG PO TABS
75.0000 mg | ORAL_TABLET | Freq: Every day | ORAL | Status: DC
  Administered 2019-02-27: 75 mg via ORAL
  Filled 2019-02-27: qty 3

## 2019-02-27 MED ORDER — ALUM & MAG HYDROXIDE-SIMETH 200-200-20 MG/5ML PO SUSP: 30.0000 mL | Freq: Four times a day (QID) | ORAL | Status: DC | PRN

## 2019-02-27 MED ORDER — ZIPRASIDONE MESYLATE 20 MG IM SOLR
INTRAMUSCULAR | Status: AC
  Administered 2019-02-27: 01:00:00 20 mg via INTRAMUSCULAR
  Filled 2019-02-27: qty 20

## 2019-02-27 MED ORDER — LORAZEPAM 1 MG PO TABS
0.0000 mg | ORAL_TABLET | Freq: Four times a day (QID) | ORAL | Status: DC
  Administered 2019-02-27 (×2): 1 mg via ORAL
  Filled 2019-02-27 (×2): qty 1

## 2019-02-27 MED ORDER — LISINOPRIL 10 MG PO TABS
10.0000 mg | ORAL_TABLET | Freq: Every day | ORAL | Status: DC
  Administered 2019-02-27 – 2019-02-28 (×2): 10 mg via ORAL
  Filled 2019-02-27 (×2): qty 1

## 2019-02-27 MED ORDER — ZIPRASIDONE MESYLATE 20 MG IM SOLR
20.0000 mg | Freq: Once | INTRAMUSCULAR | Status: AC
  Administered 2019-02-27: 01:00:00 20 mg via INTRAMUSCULAR

## 2019-02-27 MED ORDER — VITAMIN B-1 100 MG PO TABS
100.0000 mg | ORAL_TABLET | Freq: Every day | ORAL | Status: DC
  Administered 2019-02-27: 18:00:00 100 mg via ORAL
  Filled 2019-02-27: qty 1

## 2019-02-27 MED ORDER — ESCITALOPRAM OXALATE 20 MG PO TABS
20.0000 mg | ORAL_TABLET | Freq: Every day | ORAL | Status: DC
  Administered 2019-02-27 – 2019-02-28 (×2): 20 mg via ORAL
  Filled 2019-02-27: qty 1

## 2019-02-27 NOTE — ED Notes (Signed)
Spoke with TTS about assessment. Patient still sleeping. TTS agrees to let patient keep sleeping and assess in morning.

## 2019-02-27 NOTE — ED Provider Notes (Signed)
Signout from Dr. Florina Ou.  48 year old male with history of alcoholism here with suicidal ideation and agitation.  Received Geodon.  Plan is for TTS consult when more awake.  Currently not under an IVC and voluntary. Physical Exam  BP (!) 134/106 (BP Location: Right Arm)   Pulse (!) 112   Temp 98.7 F (37.1 C) (Oral)   Resp (!) 24   Ht 6' (1.829 m)   Wt 89.8 kg   SpO2 96%   BMI 26.85 kg/m   Physical Exam  ED Course/Procedures     Procedures  MDM  Received a call from TTS that they are recommending inpatient treatment and have begun a bed search.  Will get Covid testing.       Hayden Rasmussen, MD 02/27/19 1655

## 2019-02-27 NOTE — ED Notes (Signed)
Per Mechele Claude, [facility] at  [facility], they are still trying to find bed placement for pt; she was not able to confirm if any of the other facilities had denied placement at this time.

## 2019-02-27 NOTE — ED Notes (Addendum)
TTS eval in process 

## 2019-02-27 NOTE — ED Notes (Signed)
Called [facility] to get update on bed search; they are getting report right now and will call back to update.
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
## 2019-02-27 NOTE — Progress Notes (Signed)
Patient meets criteria for inpatient treatment per Acquanetta Belling, NP. No appropriate bed available at [facility] currently. CSW faxed referrals to the following facilities for review:  [facility], [place], [place], [place], [place], [place], [facility], [place], [place].  TTS will continue to seek bed placement.   Maxie Better, MSW, LCSW Clinical Social Worker 02/27/2019 11:24 AM

## 2019-02-27 NOTE — Progress Notes (Signed)
TTS consult ordered at 02:22, pt Geodon at 01:19. [facility] called TTS to advise that consult was ordered for pt. TTS tried to assess pt. Pt too sleepy to participate in assessment. TTS observed Russell Nichols and other ED staff trying to wake pt but he does not respond. TTS consult cannot be completed until the pt is alert and able to participate in assessment. TTS advised the consult needs to be removed and reordered when the pt is actually able to participate.   Lind Covert, MSW, LCSW Therapeutic Triage Specialist  [phone]

## 2019-02-27 NOTE — ED Notes (Signed)
Patient is resting comfortably, sitter 1:1 at bedside . Awaiting TTS eval

## 2019-02-27 NOTE — Progress Notes (Signed)
TTS called and spoke with Legrand Como, RN who states the pt is still sleeping and unable to be assessed at this time.   Lind Covert, MSW, LCSW Therapeutic Triage Specialist  [phone]

## 2019-02-27 NOTE — BH Assessment (Signed)
Assessment Note  Russell Nichols is an 48 y.o. male who was brought to [facility] by a Friend.  Patient reports becoming depressed 2 days after being discharged from [facility] on 02-10-2019.  He reports not following up with [facility] or being medication compliant after hospital discharged.  Patient presented orientated x4, mood "I'm depressed and think about not waking up and drinking myself to death everyday", affect depressed and irritable.  Patient reports current SI with a plan to jump off a bridge.  He denied current HI and reports previous HI towards "some people".  The Patient denied AVH, paranoia, or delusions.  Patient reports not eating for 2 days because of no appetite.  He reports getting only 2 hours of sleep per night due to racing thoughts.  He reports suffering with chronic pain in his hip.  Patient also reports being homeless and is worried about upcoming court date.   Patient has a history of psychiatric hospitalizations at [facility] for depression, suicidal ideations, and alcohol usage.  He has has outpatient mental health services from [place].  Patient denied any outpatient or residential substance use treatment for chronic alcohol use.  Patient reports wanting to be hospitalized for depression.  Per Priscille Loveless, NP; Patient meets inpatient criteria and placement will be sought.  Patient's disposition provided to Dr. Melina Copa    Diagnosis: Major Depression Disorder, recurrent, moderate w/o psychosis , Alcohol Use Disorder, recurrent   Past Medical History:  Past Medical History:  Diagnosis Date  . Alcoholism ([place])   . Hypertension   . Major depression   . PTSD (post-traumatic stress disorder)     History reviewed. No pertinent surgical history.  Family History:  Family History  Family history unknown: Yes    Social History:  reports that he has been smoking cigars. He has quit using smokeless tobacco. He reports current alcohol use. He reports current drug use. Drug: Marijuana.  Additional  Social History:  Substance #1 Name of Substance 1: Alcohol 1 - Age of First Use: Unknown 1 - Amount (size/oz): beer and wine 1 - Frequency: daily 1 - Duration: ongoing 1 - Last Use / Amount: 02/26/2019  CIWA: CIWA-Ar BP: 115/82 Pulse Rate: (!) 103 COWS:    Allergies:  Allergies  Allergen Reactions  . Penicillins Anaphylaxis and Rash    Has patient had a PCN reaction causing immediate rash, facial/tongue/throat swelling, SOB or lightheadedness with hypotension: Y Has patient had a PCN reaction causing severe rash involving mucus membranes or skin necrosis: Y Has patient had a PCN reaction that required hospitalization: N Has patient had a PCN reaction occurring within the last 10 years: N If all of the above answers are "NO", then may proceed with Cephalosporin use.     Home Medications: (Not in a hospital admission)   OB/GYN Status:  No LMP for male patient.  General Assessment Data Assessment unable to be completed: Yes Reason for not completing assessment: TTS called and spoke with Legrand Como, RN who states the pt is still sleeping and unable to be assessed at this time.  Location of Assessment: [place] TTS Assessment: In system Is this a Tele or Face-to-Face Assessment?: Tele Assessment Is this an Initial Assessment or a Re-assessment for this encounter?: Initial Assessment Patient Accompanied by:: N/A Language Other than English: No Living Arrangements: Homeless/Shelter What gender do you identify as?: Male Marital status: Single Living Arrangements: Alone Can pt return to current living arrangement?: Yes Admission Status: Voluntary Is patient capable of signing voluntary  admission?: Yes Referral Source: Self/Family/Friend Insurance type: Self pay  Medical Screening Exam [facility] Walk-in ONLY) Medical Exam completed: Yes  Crisis Care Plan Living Arrangements: Alone Name of Psychiatrist: [facility] Name of Therapist: [facility]  Education Status Is patient  currently in school?: No Is the patient employed, unemployed or receiving disability?: Unemployed  Risk to self with the past 6 months Suicidal Ideation: Yes-Currently Present Has patient been a risk to self within the past 6 months prior to admission? : Yes Suicidal Intent: No-Not Currently/Within Last 6 Months Has patient had any suicidal intent within the past 6 months prior to admission? : Yes Is patient at risk for suicide?: Yes Suicidal Plan?: Yes-Currently Present Has patient had any suicidal plan within the past 6 months prior to admission? : Yes Specify Current Suicidal Plan: jump off a bridge Access to Means: No What has been your use of drugs/alcohol within the last 12 months?: alcohol Previous Attempts/Gestures: Yes How many times?: 2 Triggers for Past Attempts: Unknown Intentional Self Injurious Behavior: None Family Suicide History: No Recent stressful life event(s): Legal Issues, Other (Comment)(homelessness) Persecutory voices/beliefs?: No Depression: Yes Depression Symptoms: Feeling worthless/self pity, Isolating, Insomnia Substance abuse history and/or treatment for substance abuse?: Yes(alcohol) Suicide prevention information given to non-admitted patients: Not applicable  Risk to Others within the past 6 months Homicidal Ideation: No-Not Currently/Within Last 6 Months Does patient have any lifetime risk of violence toward others beyond the six months prior to admission? : No Thoughts of Harm to Others: No Current Homicidal Intent: No-Not Currently/Within Last 6 Months Current Homicidal Plan: No Access to Homicidal Means: No History of harm to others?: No Assessment of Violence: None Noted Does patient have access to weapons?: No Criminal Charges Pending?: Yes Describe Pending Criminal Charges: Cruelty to animals Does patient have a court date: Yes Court Date: 03/14/19([place]) Is patient on probation?: [employer])  Psychosis Hallucinations: None noted Delusions: None noted  Mental Status Report Appearance/Hygiene: In hospital gown Eye Contact: Fair Motor Activity: Unremarkable Speech: Logical/coherent Level of Consciousness: Alert Mood: Depressed Affect: Depressed, Irritable Anxiety Level: Minimal Thought Processes: Coherent, Relevant Judgement: Unimpaired Orientation: Person, Place, Time, Situation Obsessive Compulsive Thoughts/Behaviors: None  Cognitive Functioning Concentration: Normal Memory: Recent Intact, Remote Intact Is patient IDD: No Insight: Good Impulse Control: Fair Appetite: Poor Have you had any weight changes? : No Change Sleep: Decreased Total Hours of Sleep: 2 Vegetative Symptoms: None  ADLScreening [facility] Assessment Services) Patient's cognitive ability adequate to safely complete daily activities?: Yes Patient able to express need for assistance with ADLs?: Yes Independently performs ADLs?: Yes (appropriate for developmental age)  Prior Inpatient Therapy Prior Inpatient Therapy: Yes Prior Therapy Dates: 2020, 2019, 2015[facility] Prior [facility] Facilty[facility]Provider([facility]): [facility] Reason for Treatment: depression, alcohol use  Prior Outpatient Therapy Prior Outpatient Therapy: Yes Prior Therapy Dates: 2020 Prior Therapy Facilty/Provider(s): [facility] Reason for Treatment: med management and therapy Does patient have an ACCT team?: No Does patient have Intensive In-House Services?  : No Does patient have [facility] services? : No Does patient have P4CC services?: No  ADL Screening (condition at time of admission) Patient's cognitive ability adequate to safely complete daily activities?: Yes Is the patient deaf or have difficulty hearing?: No Does the patient have difficulty seeing, even when wearing glasses/contacts?: No Does the patient have difficulty concentrating, remembering, or making decisions?: No Patient able to express need for  assistance with ADLs?: Yes Does the patient have difficulty dressing or bathing?: No Independently performs ADLs?: Yes (  appropriate for developmental age) Does the patient have difficulty walking or climbing stairs?: No Weakness of Legs: None Weakness of Arms/Hands: None  Home Assistive Devices/Equipment Home Assistive Devices/Equipment: None    Abuse/Neglect Assessment (Assessment to be complete while patient is alone) Physical Abuse: Denies Verbal Abuse: Denies Sexual Abuse: Denies Exploitation of patient/patient's resources: Denies Values / Beliefs Cultural Requests During Hospitalization: None Spiritual Requests During Hospitalization: None   Advance Directives (For Healthcare) Does Patient Have a Medical Advance Directive?: No Would patient like information on creating a medical advance directive?: No - Patient declined          Disposition:  Disposition Initial Assessment Completed for this Encounter: Yes Disposition of Patient: (pending)  On Site Evaluation by:   Reviewed with Physician:    Dey-Johnson,Chakira Jachim 02/27/2019 8:08 AM

## 2019-02-27 NOTE — ED Provider Notes (Signed)
[facility]-EMERGENCY DEPT [facility] Provider Note: Lowella DellJ. Lane Levent Kornegay, MD, FACEP  CSN: [id number] MRN: [id number] ARRIVAL: 02/27/19 at 0026 ROOM: MH12/MH12   CHIEF COMPLAINT  Suicidal  Level 5 caveat: Psychosis HISTORY OF PRESENT ILLNESS  02/27/19 12:57 AM Gerrit HallsMichael Nestle is a 48 y.o. male with a history of PTSD, alcohol abuse and depression.  He is here with suicidal ideation.  He has had thoughts of climbing a ladder and jumping off.  He also reports hallucinations that are telling him to kill himself.  A friend who accompanies him states the patient has been under increased stress recently facing several legal charges.  He is homeless and has been getting into conflicts with other tenants at homeless shelters.  In triage he was noted to be fidgety, anxious and talking back to the voices in his head.  He fell about 6 weeks ago and has had pain in his right hip since.  He is able to ambulate but he rates his pain as an 8 out of 10 when weightbearing.  Past Medical History:  Diagnosis Date  . Alcoholism ([facility])   . Hypertension   . Major depression   . PTSD (post-traumatic stress disorder)     History reviewed. No pertinent surgical history.  Family History  Family history unknown: Yes    Social History   Tobacco Use  . Smoking status: Current Every Day Smoker    Types: Cigars  . Smokeless tobacco: Former Engineer, waterUser  Substance Use Topics  . Alcohol use: Yes    Comment: last drank just PTA  . Drug use: Yes    Types: Marijuana    Comment: last used 01/23/19    Prior to Admission medications   Medication Sig Start Date End Date Taking? Authorizing Provider  escitalopram (LEXAPRO) 20 MG tablet Take 1 tablet (20 mg total) by mouth daily. 02/14/19   Aldean BakerSykes, Janet E, NP  lisinopril (ZESTRIL) 10 MG tablet Take 1 tablet (10 mg total) by mouth daily. 02/15/19   Aldean BakerSykes, Janet E, NP  Multiple Vitamin (MULTIVITAMIN WITH MINERALS) TABS tablet Take 1 tablet by mouth daily. 02/15/19   Aldean BakerSykes, Janet E, NP   QUEtiapine (SEROQUEL) 25 MG tablet Take 3 tablets (75 mg total) by mouth at bedtime. 02/14/19   Aldean BakerSykes, Janet E, NP    Allergies Penicillins   REVIEW OF SYSTEMS     PHYSICAL EXAMINATION  Initial Vital Signs Blood pressure (!) 134/106, pulse (!) 112, temperature 98.7 F (37.1 C), temperature source Oral, resp. rate (!) 24, height 6' (1.829 m), weight 89.8 kg, SpO2 96 %.  Examination General: Well-developed, well-nourished male in no acute distress; appearance consistent with age of record HENT: normocephalic; atraumatic Eyes: pupils equal, round and reactive to light; extraocular muscles intact Neck: supple Heart: regular rate and rhythm Lungs: clear to auscultation bilaterally Abdomen: soft; nondistended; nontender; bowel sounds present Extremities: No deformity; pulses normal; no edema Neurologic: Awake, alert; motor function intact in all extremities and symmetric; no facial droop Skin: Warm and dry Psychiatric: Mildly agitated; suicidal ideation; auditory hallucinations   RESULTS  Summary of this visit's results, reviewed by myself:   EKG Interpretation  Date/Time:    Ventricular Rate:    PR Interval:    QRS Duration:   QT Interval:    QTC Calculation:   R Axis:     Text Interpretation:        Laboratory Studies: Results for orders placed or performed during the hospital encounter of 02/27/19 (from the past 24 hour(s))  CBC with Differential/Platelet     Status: Abnormal   Collection Time: 02/27/19  1:51 AM  Result Value Ref Range   WBC 10.6 (H) 4.0 - 10.5 K/uL   RBC 5.27 4.22 - 5.81 MIL/uL   Hemoglobin 16.3 13.0 - 17.0 g/dL   HCT 48.0 39.0 - 52.0 %   MCV 91.1 80.0 - 100.0 fL   MCH 30.9 26.0 - 34.0 pg   MCHC 34.0 30.0 - 36.0 g/dL   RDW 13.8 11.5 - 15.5 %   Platelets 330 150 - 400 K/uL   nRBC 0.0 0.0 - 0.2 %   Neutrophils Relative % 71 %   Neutro Abs 7.7 1.7 - 7.7 K/uL   Lymphocytes Relative 20 %   Lymphs Abs 2.1 0.7 - 4.0 K/uL   Monocytes Relative 6  %   Monocytes Absolute 0.7 0.1 - 1.0 K/uL   Eosinophils Relative 1 %   Eosinophils Absolute 0.1 0.0 - 0.5 K/uL   Basophils Relative 1 %   Basophils Absolute 0.1 0.0 - 0.1 K/uL   Immature Granulocytes 1 %   Abs Immature Granulocytes 0.07 0.00 - 0.07 K/uL  Comprehensive metabolic panel     Status: Abnormal   Collection Time: 02/27/19  1:51 AM  Result Value Ref Range   Sodium 138 135 - 145 mmol/L   Potassium 3.8 3.5 - 5.1 mmol/L   Chloride 102 98 - 111 mmol/L   CO2 18 (L) 22 - 32 mmol/L   Glucose, Bld 113 (H) 70 - 99 mg/dL   BUN 15 6 - 20 mg/dL   Creatinine, Ser 0.89 0.61 - 1.24 mg/dL   Calcium 9.4 8.9 - 10.3 mg/dL   Total Protein 7.9 6.5 - 8.1 g/dL   Albumin 4.6 3.5 - 5.0 g/dL   AST 24 15 - 41 U/L   ALT 33 0 - 44 U/L   Alkaline Phosphatase 60 38 - 126 U/L   Total Bilirubin 0.9 0.3 - 1.2 mg/dL   GFR calc non Af Amer >60 >60 mL/min   GFR calc Af Amer >60 >60 mL/min   Anion gap 18 (H) 5 - 15  Ethanol     Status: Abnormal   Collection Time: 02/27/19  1:51 AM  Result Value Ref Range   Alcohol, Ethyl (B) 263 (H) <40 mg/dL  Salicylate level     Status: None   Collection Time: 02/27/19  1:51 AM  Result Value Ref Range   Salicylate Lvl <9.8 2.8 - 30.0 mg/dL  Acetaminophen level     Status: Abnormal   Collection Time: 02/27/19  1:51 AM  Result Value Ref Range   Acetaminophen (Tylenol), Serum <10 (L) 10 - 30 ug/mL   Imaging Studies: Dg Hip Unilat W Or Wo Pelvis 2-3 Views Right  Result Date: 02/27/2019 CLINICAL DATA:  Fall 6 weeks ago. Right hip pain. EXAM: DG HIP (WITH OR WITHOUT PELVIS) 2-3V RIGHT COMPARISON:  None. FINDINGS: The cortical margins of the bony pelvis and right hip are intact. No fracture. Pubic symphysis and sacroiliac joints are congruent. Both femoral heads are well-seated in the respective acetabula. IMPRESSION: Unremarkable radiographs of the pelvis and right hip. Electronically Signed   By: Keith Rake M.D.   On: 02/27/2019 01:50    ED COURSE and MDM   Nursing notes and initial vitals signs, including pulse oximetry, reviewed.  Vitals:   02/27/19 0050 02/27/19 0052  BP: (!) 134/106   Pulse: (!) 112   Resp: (!) 24   Temp: 98.7 F (  37.1 C)   TempSrc: Oral   SpO2: 96%   Weight:  89.8 kg  Height:  6' (1.829 m)   6:55 AM TTS consult pending.  PROCEDURES    ED DIAGNOSES     ICD-10-CM   1. Suicidal ideation  R45.851   2. Psychotic depression (HCC)  F32.3   3. Alcoholic intoxication without complication (HCC)  F10.920        Mercie Balsley, Jonny Ruiz, MD 02/28/19 [phone]

## 2019-02-28 ENCOUNTER — Encounter (HOSPITAL_COMMUNITY): Payer: Self-pay | Admitting: Psychiatry

## 2019-02-28 ENCOUNTER — Observation Stay (HOSPITAL_COMMUNITY)
Admission: AD | Admit: 2019-02-28 | Discharge: 2019-03-01 | Disposition: A | Discharge status: Home / Self Care | Payer: No Typology Code available for payment source | Source: Intra-hospital | Attending: Psychiatry | Admitting: Psychiatry

## 2019-02-28 ENCOUNTER — Other Ambulatory Visit: Payer: Self-pay

## 2019-02-28 DIAGNOSIS — F332 Major depressive disorder, recurrent severe without psychotic features: Secondary | ICD-10-CM | POA: Insufficient documentation

## 2019-02-28 DIAGNOSIS — F4321 Adjustment disorder with depressed mood: Principal | ICD-10-CM | POA: Insufficient documentation

## 2019-02-28 DIAGNOSIS — Z79899 Other long term (current) drug therapy: Secondary | ICD-10-CM | POA: Insufficient documentation

## 2019-02-28 DIAGNOSIS — Z880 Allergy status to penicillin: Secondary | ICD-10-CM | POA: Insufficient documentation

## 2019-02-28 DIAGNOSIS — Z5900 Homelessness unspecified: Secondary | ICD-10-CM

## 2019-02-28 DIAGNOSIS — F1024 Alcohol dependence with alcohol-induced mood disorder: Secondary | ICD-10-CM | POA: Insufficient documentation

## 2019-02-28 DIAGNOSIS — R45851 Suicidal ideations: Secondary | ICD-10-CM | POA: Insufficient documentation

## 2019-02-28 DIAGNOSIS — Z560 Unemployment, unspecified: Secondary | ICD-10-CM | POA: Insufficient documentation

## 2019-02-28 DIAGNOSIS — I10 Essential (primary) hypertension: Secondary | ICD-10-CM | POA: Insufficient documentation

## 2019-02-28 DIAGNOSIS — F17290 Nicotine dependence, other tobacco product, uncomplicated: Secondary | ICD-10-CM | POA: Insufficient documentation

## 2019-02-28 DIAGNOSIS — Y906 Blood alcohol level of 120-199 mg/100 ml: Secondary | ICD-10-CM | POA: Insufficient documentation

## 2019-02-28 HISTORY — DX: Cardiac murmur, unspecified: R01.1

## 2019-02-28 MED ORDER — QUETIAPINE FUMARATE 50 MG PO TABS
75.0000 mg | ORAL_TABLET | Freq: Every day | ORAL | Status: DC
  Administered 2019-02-28: 75 mg via ORAL
  Filled 2019-02-28: qty 1

## 2019-02-28 MED ORDER — ALUM & MAG HYDROXIDE-SIMETH 200-200-20 MG/5ML PO SUSP: 30.0000 mL | ORAL | Status: DC | PRN

## 2019-02-28 MED ORDER — ESCITALOPRAM OXALATE 10 MG PO TABS
20.0000 mg | ORAL_TABLET | Freq: Every day | ORAL | Status: DC
  Administered 2019-03-01: 20 mg via ORAL
  Filled 2019-02-28: qty 2

## 2019-02-28 MED ORDER — TRAZODONE HCL 100 MG PO TABS: 100.0000 mg | ORAL_TABLET | Freq: Every evening | ORAL | Status: DC | PRN

## 2019-02-28 MED ORDER — ACETAMINOPHEN 325 MG PO TABS: 650.0000 mg | ORAL_TABLET | Freq: Four times a day (QID) | ORAL | Status: DC | PRN

## 2019-02-28 MED ORDER — MAGNESIUM HYDROXIDE 400 MG/5ML PO SUSP: 30.0000 mL | Freq: Every day | ORAL | Status: DC | PRN

## 2019-02-28 MED ORDER — LISINOPRIL 10 MG PO TABS
10.0000 mg | ORAL_TABLET | Freq: Every day | ORAL | Status: DC
  Administered 2019-03-01: 10 mg via ORAL
  Filled 2019-02-28: qty 1

## 2019-02-28 MED ORDER — HYDROXYZINE HCL 25 MG PO TABS
25.0000 mg | ORAL_TABLET | Freq: Three times a day (TID) | ORAL | Status: DC | PRN
  Administered 2019-02-28 – 2019-03-01 (×2): 25 mg via ORAL
  Filled 2019-02-28 (×3): qty 1

## 2019-02-28 MED ORDER — ADULT MULTIVITAMIN W/MINERALS CH
1.0000 | ORAL_TABLET | Freq: Every day | ORAL | Status: DC
  Administered 2019-03-01: 1 via ORAL
  Filled 2019-02-28: qty 1

## 2019-02-28 NOTE — ED Notes (Signed)
Breakfast given.  

## 2019-02-28 NOTE — BHH Counselor (Signed)
  REASSESSMENT  [place] reevaluated pt for safety and stability.  Pt was observed sitting on the bed, alert, and oriented x3.  Pt states, "I feel depressed.  I don't want to live.  I wish I hadn't woke up today.  I'm tired of living like this.  No one will help me. People think medicine will work but I need someone to talk with to help me get through this.  I helped so many people in my life and no one will help me."  [facility] asked pt if he had a suicide plan?  Pt responded "yes, I was going to jump off a ladder but I couldn't find one tall enough so I'm going to jump off a bridge." [facility] asked pt if he was hearing voices?  Pt sates, "in my head but not outside of my head."  Pt denies HI/V-hallucinations.  Pt continues to meet inpatient criteria.  TTS will continue to look for inpatient placement for patient.  Iris Tatsch L. [place], [place], [facility], [facility] Therapeutic Triage Specialist  [phone]

## 2019-02-28 NOTE — ED Notes (Signed)
AC at [facility] advising it will be at least tomorrow before a bed will become available.

## 2019-02-28 NOTE — Progress Notes (Signed)
Patient ID: Russell Nichols, male   DOB: Dec 20, 1970, 48 y.o.   MRN: [id number] Pt sitting in the dayroom watching TV with peer. Pt is calm and cooperative with staff. Denies SI, H, AVH at this time. Pt contracted for safety.

## 2019-02-28 NOTE — Progress Notes (Signed)
Patient ID: Russell Nichols, male   DOB: Sep 10, 1970, 48 y.o.   MRN: [id number] Patient presents voluntarily reporting that he has been feeling increasingly depressed after his dog died, he lost his job and was kicked out of the house. Reports that he does not see the reason for living. Currently homeless without any money or food. He presents with pain in the right hip related to a fall. He is sad and depressed and stated "I don't see the reason for living, I have nothing to live for". Patient is restless, disheveled and anxious. Was recently hospitalized for the same problem. Skin assessment performed and no issues noted. Patient is admitted for observations. Safety precautions initiated.

## 2019-02-28 NOTE — ED Notes (Signed)
[facility] at [facility] who will look into accepting transfer to hold pt. Will call again around lunch.

## 2019-02-28 NOTE — ED Notes (Signed)
Report received. Pt resting quietly at present. Sitter at bedside.

## 2019-02-28 NOTE — ED Notes (Signed)
Caroline at [facility] called and advised pt could be brought to observation unit at [facility]

## 2019-02-28 NOTE — Progress Notes (Signed)
Pt was brought to the ED by ambulance because she was homeless and seeking resources. She arrived with a medium sized suitcase on wheels. Shelter resources were provided. Pt was transported to the [facility]. Pt denied the need for other resources including clothes.

## 2019-02-28 NOTE — ED Notes (Signed)
Sprite given 

## 2019-02-28 NOTE — ED Notes (Signed)
Pt speaking with BH for reassessment 

## 2019-02-28 NOTE — Progress Notes (Signed)
Reviewed patient with Gayland Curry., NP.  Patient will go to room 202 Observation Unit.  Please call report to Louanne Skye, [place] at [phone]. Spoke with Jenny Reichmann, charge RN regarding transport to [facility].

## 2019-02-28 NOTE — Progress Notes (Signed)
Patient was in and out of bed, frequently asking about his admission. Irritable and demanding. Reporting that he will not be safe if he does not get admitted to Inpatient. Patient ate his meals as served. Requested medication for anxiety and vistaril was given. Currently in the dayroom watching TV. Safety monitored.

## 2019-03-01 ENCOUNTER — Ambulatory Visit: Payer: Self-pay | Admitting: Family Medicine

## 2019-03-01 ENCOUNTER — Ambulatory Visit (HOSPITAL_COMMUNITY): Payer: Self-pay

## 2019-03-01 ENCOUNTER — Encounter (HOSPITAL_COMMUNITY): Payer: Self-pay | Admitting: Registered Nurse

## 2019-03-01 DIAGNOSIS — Z5900 Homelessness unspecified: Secondary | ICD-10-CM

## 2019-03-01 DIAGNOSIS — F4321 Adjustment disorder with depressed mood: Secondary | ICD-10-CM

## 2019-03-01 MED ORDER — QUETIAPINE FUMARATE 25 MG PO TABS: 75.0000 mg | ORAL_TABLET | Freq: Every day | ORAL | 0 refills | Status: AC

## 2019-03-01 NOTE — Discharge Instructions (Signed)
For your behavioral health needs, you are advised to follow up with [facility]:       [facility]      [address]      [place], [place] [address]       [phone]

## 2019-03-01 NOTE — BH Assessment (Addendum)
[facility] Assessment Progress Note  Per Hampton Abbot, MD, this pt does not require psychiatric hospitalization at this time.  Pt is to be discharged from the [facility] Observation Unit with referrals.  Discharge instructions advise pt to follow up with [facility].  Pt would also benefit from seeing Peer Support Specialists; they will be asked to speak to pt..  Pt's nurse, Demont, has been notified.  Jalene Mullet, [place] Triage Specialist [phone]

## 2019-03-01 NOTE — Discharge Summary (Addendum)
[facility]  03/01/2019 12:28 PM Gerrit HallsMichael Buffone  MRN:  [id number] Principal Problem: Adjustment disorder with depressed mood Discharge Diagnoses: Principal Problem:   Adjustment disorder with depressed mood Active Problems:   Alcohol dependence with alcohol-induced mood disorder [facility](HCC)   MDD (major depressive disorder), recurrent episode, severe (HCC)   Homelessness   Subjective:Per admission assessment note: Presented to ED voluntarily on 9/28 . Of note, although he initially presented voluntarily, he attempted to leave after checking in, and was eventually  admitted under commitment. (Similarly, he also expressed reluctance to enter inpatient psychiatric unit yesterday evening but eventually did ). Endorsed depression, anxiety, suicidal ideations, described as passive ,reporting he felt things would be better if he were dead. Attributed depression in part to recent stressors, including  death of his pet dog 2 weeks ago, unemployment/difficulty finding a job and losing an employment opportunity due to (+) cannabis on UDS, homelessness , recent relapse into heavy drinking . He endorses history of alcohol dependence, states that he had been drinking significantly less than before, but that on day of admission did drink more heavily. Admission BAL 171, UDS negative.  Evaluation: [name] was seen and evaluated by attending psychiatrist and NP.  Was reported patient was denying suicidal or homicidal ideations during assessment. Reported that patient has denied  auditory or visual hallucinations.  Patient was provided with additional  outpatient resources.  Peers support to follow-up.  [facility]. Support encouragement reassurance was provided.  Total Time spent with patient: 15 minutes  Past Psychiatric History:  Past Medical History:  Past Medical History:  Diagnosis Date  . Alcoholism (HCC)   . Heart murmur   .  Hypertension   . Major depression   . PTSD (post-traumatic stress disorder)    History reviewed. No pertinent surgical history. Family History:  Family History  Family history unknown: Yes   Family Psychiatric  History:  Social History:  Social History   Substance and Sexual Activity  Alcohol Use Yes   Comment: last drank just PTA     Social History   Substance and Sexual Activity  Drug Use Yes  . Types: Marijuana   Comment: last used 01/23/19    Social History   Socioeconomic History  . Marital status: Single    Spouse name: Not on file  . Number of children: Not on file  . Years of education: Not on file  . Highest education level: Not on file  Occupational History  . Not on file  Social Needs  . Financial resource strain: Not on file  . Food insecurity    Worry: Not on file    Inability: Not on file  . Transportation needs    Medical: Not on file    Non-medical: Not on file  Tobacco Use  . Smoking status: Current Every Day Smoker    Types: Cigars  . Smokeless tobacco: Former Engineer, waterUser  Substance and Sexual Activity  . Alcohol use: Yes    Comment: last drank just PTA  . Drug use: Yes    Types: Marijuana    Comment: last used 01/23/19  . Sexual activity: Not on file  Lifestyle  . Physical activity    Days per week: Not on file    Minutes per session: Not on file  . Stress: Not on file  Relationships  . Social Musicianconnections    Talks on phone: Not on file    Gets together: Not on  file    Attends religious service: Not on file    Active member of club or organization: Not on file    Attends meetings of clubs or organizations: Not on file    Relationship status: Not on file  Other Topics Concern  . Not on file  Social History Narrative  . Not on file    Has this patient used any form of tobacco in the last 30 days? (Cigarettes, Smokeless Tobacco, Cigars, and/or Pipes) A prescription for an FDA-approved tobacco cessation medication was offered at discharge  and the patient refused  Current Medications: Current Facility-Administered Medications  Medication Dose Route Frequency Provider Last Rate Last Dose  . acetaminophen (TYLENOL) tablet 650 mg  650 mg Oral Q6H PRN Maryagnes Amos, FNP      . alum & mag hydroxide-simeth (MAALOX/MYLANTA) 200-200-20 MG/5ML suspension 30 mL  30 mL Oral Q4H PRN Starkes-Perry, Juel Burrow, FNP      . escitalopram (LEXAPRO) tablet 20 mg  20 mg Oral Daily Nira Conn A, NP   20 mg at 03/01/19 1540  . hydrOXYzine (ATARAX/VISTARIL) tablet 25 mg  25 mg Oral TID PRN Maryagnes Amos, FNP   25 mg at 03/01/19 1139  . lisinopril (ZESTRIL) tablet 10 mg  10 mg Oral Daily Nira Conn A, NP   10 mg at 03/01/19 0867  . magnesium hydroxide (MILK OF MAGNESIA) suspension 30 mL  30 mL Oral Daily PRN Maryagnes Amos, FNP      . multivitamin with minerals tablet 1 tablet  1 tablet Oral Daily Nira Conn A, NP   1 tablet at 03/01/19 0927  . QUEtiapine (SEROQUEL) tablet 75 mg  75 mg Oral QHS Nira Conn A, NP   75 mg at 02/28/19 2123  . traZODone (DESYREL) tablet 100 mg  100 mg Oral QHS PRN Starkes-Perry, Juel Burrow, FNP       PTA Medications: Medications Prior to Admission  Medication Sig Dispense Refill Last Dose  . escitalopram (LEXAPRO) 20 MG tablet Take 1 tablet (20 mg total) by mouth daily. 30 tablet 0   . lisinopril (ZESTRIL) 10 MG tablet Take 1 tablet (10 mg total) by mouth daily. 30 tablet 0   . Multiple Vitamin (MULTIVITAMIN WITH MINERALS) TABS tablet Take 1 tablet by mouth daily.     . QUEtiapine (SEROQUEL) 25 MG tablet Take 3 tablets (75 mg total) by mouth at bedtime. 90 tablet 0     Musculoskeletal: Strength & Muscle Tone: within normal limits Gait & Station: normal Patient leans: N/A  Psychiatric Specialty Exam: Physical Exam  Vitals reviewed. Neurological: He is alert.  Psychiatric: He has a normal mood and affect. His behavior is normal.    Review of Systems  Psychiatric/Behavioral: Positive  for depression and substance abuse. The patient is nervous/anxious.   All other systems reviewed and are negative.   Blood pressure (!) 128/95, pulse 89, temperature (!) 96.7 F (35.9 C), temperature source Oral, resp. rate 16, height 6' (1.829 m), weight 88.6 kg.Body mass index is 26.5 kg/m.  General Appearance: Casual  Eye Contact:  Fair  Speech:  Clear and Coherent  Volume:  Normal  Mood:  Anxious  Affect:  Congruent  Thought Process:  Coherent  Orientation:  Full (Time, Place, and Person)  Thought Content:  WDL  Suicidal Thoughts:  No  Homicidal Thoughts:  No  Memory:  Immediate;   Fair Recent;   Fair  Judgement:  Fair  Insight:  Fair  Psychomotor Activity:  Normal  Concentration:  Concentration: Fair  Recall:  [employer] of Knowledge:  Fair  Language:  Fair  Akathisia:  No  Handed:  Right  AIMS (if indicated):     Assets:  Communication Skills Desire for Improvement Resilience Social Support  ADL's:  Intact  Cognition:  WNL  Sleep:        Demographic Factors:  Male and Caucasian  Loss Factors: NA  Historical Factors: NA  Risk Reduction Factors:   Sense of responsibility to family  Continued Clinical Symptoms:  Depression:   Severe  Cognitive Features That Contribute To Risk:  Closed-mindedness    Suicide Risk:  Minimal: No identifiable suicidal ideation.  Patients presenting with no risk factors but with morbid ruminations; may be classified as minimal risk based on the severity of the depressive symptoms    Plan Of Care/Follow-up recommendations:  Activity:  as tolerated Diet:  heart healthy   Allergies as of 03/01/2019      Reactions   Penicillins Anaphylaxis, Rash   Has patient had a PCN reaction causing immediate rash, facial/tongue/throat swelling, SOB or lightheadedness with hypotension: Y Has patient had a PCN reaction causing severe rash involving mucus membranes or skin necrosis: Y Has patient had a PCN reaction that required  hospitalization: N Has patient had a PCN reaction occurring within the last 10 years: N If all of the above answers are "NO", then may proceed with Cephalosporin use.      Medication List    TAKE these medications   escitalopram 20 MG tablet Commonly known as: LEXAPRO Take 1 tablet (20 mg total) by mouth daily.   lisinopril 10 MG tablet Commonly known as: ZESTRIL Take 1 tablet (10 mg total) by mouth daily.   multivitamin with minerals Tabs tablet Take 1 tablet by mouth daily.   QUEtiapine 25 MG tablet Commonly known as: SEROQUEL Take 3 tablets (75 mg total) by mouth at bedtime.      Disposition: No evidence of imminent risk to self or others at present.   Recommend psychiatric Inpatient admission when medically cleared. Patient does not meet criteria for psychiatric inpatient admission. Refer to IOP. Discussed crisis plan, support from social network, calling 911, coming to the Emergency Department, and calling Suicide Hotline.  Cucumber Rankin, NP 03/01/2019, 12:28 PM

## 2019-03-01 NOTE — Progress Notes (Signed)
Progress note  Pt's friend, Russell Nichols, spoke to this writer and left their contact information if any collateral is needed for the patient. Mr. Russell Nichols states the pt use to live together and can assist if we need. [phone]

## 2019-03-01 NOTE — Progress Notes (Signed)
Discharge note  Patient verbalizes readiness for discharge. Follow up plan explained, AVS, Transition record and SRA given. Prescriptions and teaching provided. Belongings returned and signed for.. Patient verbalizes understanding. Patient denies SI/HI and assures this Probation officer he will seek assistance should that change. Patient discharged to lobby with [facility] bus pass and follow up to [facility].

## 2019-03-01 NOTE — Progress Notes (Signed)
Pt rested well this shift; no attempts made to harm self or others. No complaints voiced or apparent distress noted. Will continue to maintain safety.

## 2019-03-01 NOTE — Patient Outreach (Signed)
CPSS met with Pt and was able to gain information to better assist Pt with finding services. CPSS was made aware that pt can attned [employer], Pt was made aware of what steps to get himself into the program. [facility] an [facility] office and was able to discuss the situation to continue to assist Pt with getting the help he needed.

## 2019-03-10 ENCOUNTER — Ambulatory Visit: Payer: Self-pay | Admitting: Family Medicine

## 2019-04-12 ENCOUNTER — Telehealth: Payer: Self-pay | Admitting: General Practice

## 2019-04-12 NOTE — Telephone Encounter (Signed)
He has never been seen in this clinic. Will address once he establishes care.

## 2019-04-12 NOTE — Telephone Encounter (Signed)
1) Medication(s) Requested (by name): lisinopril (ZESTRIL) 10 MG tablet [[id number]]  2) Pharmacy of Choice:  [facility] [place]   Patient does not have enough to last until his appointment on 04/28/2019.    Approved medications will be sent to pharmacy, we will reach out to you if there is an issue.  Requests made after 3pm may not be addressed until following business day!

## 2019-04-28 ENCOUNTER — Other Ambulatory Visit: Payer: Self-pay

## 2019-04-28 ENCOUNTER — Encounter: Payer: Self-pay | Admitting: Family Medicine

## 2019-04-28 ENCOUNTER — Ambulatory Visit: Payer: Self-pay | Attending: Family Medicine | Admitting: Family Medicine

## 2019-04-28 DIAGNOSIS — I10 Essential (primary) hypertension: Secondary | ICD-10-CM

## 2019-04-28 DIAGNOSIS — M19041 Primary osteoarthritis, right hand: Secondary | ICD-10-CM

## 2019-04-28 DIAGNOSIS — F3289 Other specified depressive episodes: Secondary | ICD-10-CM

## 2019-04-28 DIAGNOSIS — M19042 Primary osteoarthritis, left hand: Secondary | ICD-10-CM

## 2019-04-28 DIAGNOSIS — G5601 Carpal tunnel syndrome, right upper limb: Secondary | ICD-10-CM

## 2019-04-28 DIAGNOSIS — M5431 Sciatica, right side: Secondary | ICD-10-CM

## 2019-04-28 MED ORDER — PREDNISONE 20 MG PO TABS: 20.0000 mg | ORAL_TABLET | Freq: Every day | ORAL | 0 refills | Status: AC

## 2019-04-28 MED ORDER — MELOXICAM 7.5 MG PO TABS: 7.5000 mg | ORAL_TABLET | Freq: Every day | ORAL | 1 refills | Status: DC

## 2019-04-28 MED ORDER — ESCITALOPRAM OXALATE 20 MG PO TABS: 20.0000 mg | ORAL_TABLET | Freq: Every day | ORAL | 3 refills | Status: DC

## 2019-04-28 MED ORDER — LISINOPRIL 10 MG PO TABS: 10.0000 mg | ORAL_TABLET | Freq: Every day | ORAL | 3 refills | Status: DC

## 2019-04-28 MED ORDER — METHOCARBAMOL 500 MG PO TABS: 500.0000 mg | ORAL_TABLET | Freq: Three times a day (TID) | ORAL | 1 refills | Status: DC | PRN

## 2019-04-28 NOTE — Progress Notes (Signed)
Virtual Visit via Telephone Note  I connected with Russell Nichols, on 04/28/2019 at 10:53 AM by telephone due to the COVID-19 pandemic and verified that I am speaking with the correct person using two identifiers.   Consent: I discussed the limitations, risks, security and privacy concerns of performing an evaluation and management service by telephone and the availability of in person appointments. I also discussed with the patient that there may be a patient responsible charge related to this service. The patient expressed understanding and agreed to proceed.   Location of Patient: Home  Location of Provider: Clinic   Persons participating in Telemedicine visit: Russell Nichols-CMA Dr. Alvis Lemmings     History of Present Illness: He is a 48 year old R handed male who presents today to establish care.  He has pain in his hands and couldn't make a fist in his R hand this morning; he has noticed swelling of his R hand as well. Also has similar symptoms in L hand. Does manual labor for a living. Arms go numb sometimes at night and hand pain could get up to 8/10. He sometimes is unable to open bottles. R leg has bothered him after he took a fall 3 months ago; described as a tooth ache which is severe. He has a little bit of chronic low back pain. R leg pain is a 5/10 constantly. Ibuprofen is minimaly effective and he alternates it with Tylenol. Tumeric has been helpful Pain radiates down the leg from his buttock  He is being tapered off Seroquel but remains on Lexapro for depression.  Past Medical History:  Diagnosis Date  . Alcoholism (HCC)   . Heart murmur   . Hypertension   . Major depression   . PTSD (post-traumatic stress disorder)    Allergies  Allergen Reactions  . Penicillins Anaphylaxis and Rash    Has patient had a PCN reaction causing immediate rash, facial/tongue/throat swelling, SOB or lightheadedness with hypotension: Y Has patient had a PCN  reaction causing severe rash involving mucus membranes or skin necrosis: Y Has patient had a PCN reaction that required hospitalization: N Has patient had a PCN reaction occurring within the last 10 years: N If all of the above answers are "NO", then may proceed with Cephalosporin use.     Current Outpatient Medications on File Prior to Visit  Medication Sig Dispense Refill  . escitalopram (LEXAPRO) 20 MG tablet Take 1 tablet (20 mg total) by mouth daily. 30 tablet 0  . lisinopril (ZESTRIL) 10 MG tablet Take 1 tablet (10 mg total) by mouth daily. 30 tablet 0  . Multiple Vitamin (MULTIVITAMIN WITH MINERALS) TABS tablet Take 1 tablet by mouth daily.    . QUEtiapine (SEROQUEL) 25 MG tablet Take 3 tablets (75 mg total) by mouth at bedtime. (Patient not taking: Reported on 04/28/2019) 30 tablet 0   No current facility-administered medications on file prior to visit.    Observations/Objective: Alert, awake, oriented x3 Not in acute distress  Assessment and Plan: 1. Primary osteoarthritis of both hands Short course of prednisone initiated and he will commence NSAIDs once prednisone dose is completed  2. Sciatica of right side - predniSONE (DELTASONE) 20 MG tablet; Take 1 tablet (20 mg total) by mouth daily with breakfast.  Dispense: 10 tablet; Refill: 0 - meloxicam (MOBIC) 7.5 MG tablet; Take 1 tablet (7.5 mg total) by mouth daily.  Dispense: 30 tablet; Refill: 1 - methocarbamol (ROBAXIN) 500 MG tablet; Take 1 tablet (500 mg total) by mouth every  8 (eight) hours as needed for muscle spasms.  Dispense: 60 tablet; Refill: 1  3. Carpal tunnel syndrome of right wrist Advised to use wrist brace - predniSONE (DELTASONE) 20 MG tablet; Take 1 tablet (20 mg total) by mouth daily with breakfast.  Dispense: 10 tablet; Refill: 0 - meloxicam (MOBIC) 7.5 MG tablet; Take 1 tablet (7.5 mg total) by mouth daily.  Dispense: 30 tablet; Refill: 1  4. Other depression Stable - escitalopram (LEXAPRO) 20 MG  tablet; Take 1 tablet (20 mg total) by mouth daily.  Dispense: 30 tablet; Refill: 3  5. Essential hypertension Stable Counseled on blood pressure goal of less than 130/80, low-sodium, DASH diet, medication compliance, 150 minutes of moderate intensity exercise per week. Discussed medication compliance, adverse effects. - lisinopril (ZESTRIL) 10 MG tablet; Take 1 tablet (10 mg total) by mouth daily.  Dispense: 30 tablet; Refill: 3   Follow Up Instructions: Return in about 6 weeks (around 06/09/2019) for Follow-up of osteoarthritis and sciatica.    I discussed the assessment and treatment plan with the patient. The patient was provided an opportunity to ask questions and all were answered. The patient agreed with the plan and demonstrated an understanding of the instructions.   The patient was advised to call back or seek an in-person evaluation if the symptoms worsen or if the condition fails to improve as anticipated.     I provided 20 minutes total of non-face-to-face time during this encounter including median intraservice time, reviewing previous notes, labs, imaging, medications, management and patient verbalized understanding.     Charlott Rakes, MD, FAAFP. [facility] [place], [place]   04/28/2019, 10:53 AM

## 2019-04-28 NOTE — Progress Notes (Signed)
Patient has been called and DOB has been verified. Patient has been screened and transferred to PCP to start phone visit.   Patient is having pain in his hands back, and right leg.

## 2019-04-29 MED FILL — ESCITALOPRAM 20 MG TABLET: 20 | 30 days supply | Qty: 30 | Fill #0

## 2019-04-29 MED FILL — LISINOPRIL 10 MG TABS: 10 | 30 days supply | Qty: 30 | Fill #0

## 2019-04-29 MED FILL — MELOXICAM 7.5 MG TABLET: 7.5 | 30 days supply | Qty: 30 | Fill #0

## 2019-04-29 MED FILL — METHOCARBAMOL 500 MG TABS: 500 | 20 days supply | Qty: 60 | Fill #0

## 2019-04-29 MED FILL — predniSONE 20 MG TABS: 20 | 10 days supply | Qty: 10 | Fill #0

## 2019-05-20 ENCOUNTER — Telehealth: Payer: Self-pay | Admitting: Family Medicine

## 2019-05-20 NOTE — Telephone Encounter (Signed)
Can you please see if the nurse would like a copy of his med list? If so we can fax  to her. Thanks.

## 2019-05-20 NOTE — Telephone Encounter (Signed)
Patients friend Phylliss Blakes called to inform PCP that pt is in [place] and is requesting a mattress pad for his back issues. Pt would also like our office call the nurse line to make sure his medications are given properly. Please follow up  Fax:[phone] [facility] Nurse line:[phone] P

## 2019-05-20 NOTE — Telephone Encounter (Signed)
Will route to PCP for review. 

## 2019-05-21 NOTE — Telephone Encounter (Signed)
Medication list has been faxed over 

## 2019-06-09 ENCOUNTER — Ambulatory Visit: Payer: Self-pay | Admitting: Family Medicine

## 2019-06-11 ENCOUNTER — Telehealth: Payer: Self-pay | Admitting: Family Medicine

## 2019-06-11 DIAGNOSIS — I10 Essential (primary) hypertension: Secondary | ICD-10-CM

## 2019-06-11 DIAGNOSIS — F3289 Other specified depressive episodes: Secondary | ICD-10-CM

## 2019-06-11 DIAGNOSIS — M5431 Sciatica, right side: Secondary | ICD-10-CM

## 2019-06-11 DIAGNOSIS — M19041 Primary osteoarthritis, right hand: Secondary | ICD-10-CM

## 2019-06-11 DIAGNOSIS — G5601 Carpal tunnel syndrome, right upper limb: Secondary | ICD-10-CM

## 2019-06-11 MED ORDER — ESCITALOPRAM OXALATE 20 MG PO TABS: 20.0000 mg | ORAL_TABLET | Freq: Every day | ORAL | 3 refills | Status: AC

## 2019-06-11 MED ORDER — MELOXICAM 7.5 MG PO TABS: 7.5000 mg | ORAL_TABLET | Freq: Every day | ORAL | 3 refills | Status: AC

## 2019-06-11 MED ORDER — METHOCARBAMOL 500 MG PO TABS: 500.0000 mg | ORAL_TABLET | Freq: Three times a day (TID) | ORAL | 3 refills | Status: AC | PRN

## 2019-06-11 MED ORDER — LISINOPRIL 10 MG PO TABS: 10.0000 mg | ORAL_TABLET | Freq: Every day | ORAL | 3 refills | Status: AC

## 2019-06-11 NOTE — Telephone Encounter (Signed)
Patient's contact noted on file -Phylliss Blakes called to request refills of this patient's medications as he is currently in jail.  He also needs a doctor's note allowing him to use a mattress while in jail.   Can you please contact patient's contact on file-Larry Grimsley to come pick up the letter.  Refills have been sent to pharmacy on file.  Thank you.

## 2019-06-11 NOTE — Telephone Encounter (Signed)
Patient was called and informed of medication being sent over to pharmacy. °

## 2021-09-23 IMAGING — DX DG HIP (WITH OR WITHOUT PELVIS) 2-3V*R*
3 series · 3 of 3 positions shown · non-contrast
Comparison: None.

CLINICAL DATA: Fall 6 weeks ago. Right hip pain.

EXAM:
DG HIP (WITH OR WITHOUT PELVIS) 2-3V RIGHT

[pelvis ap]
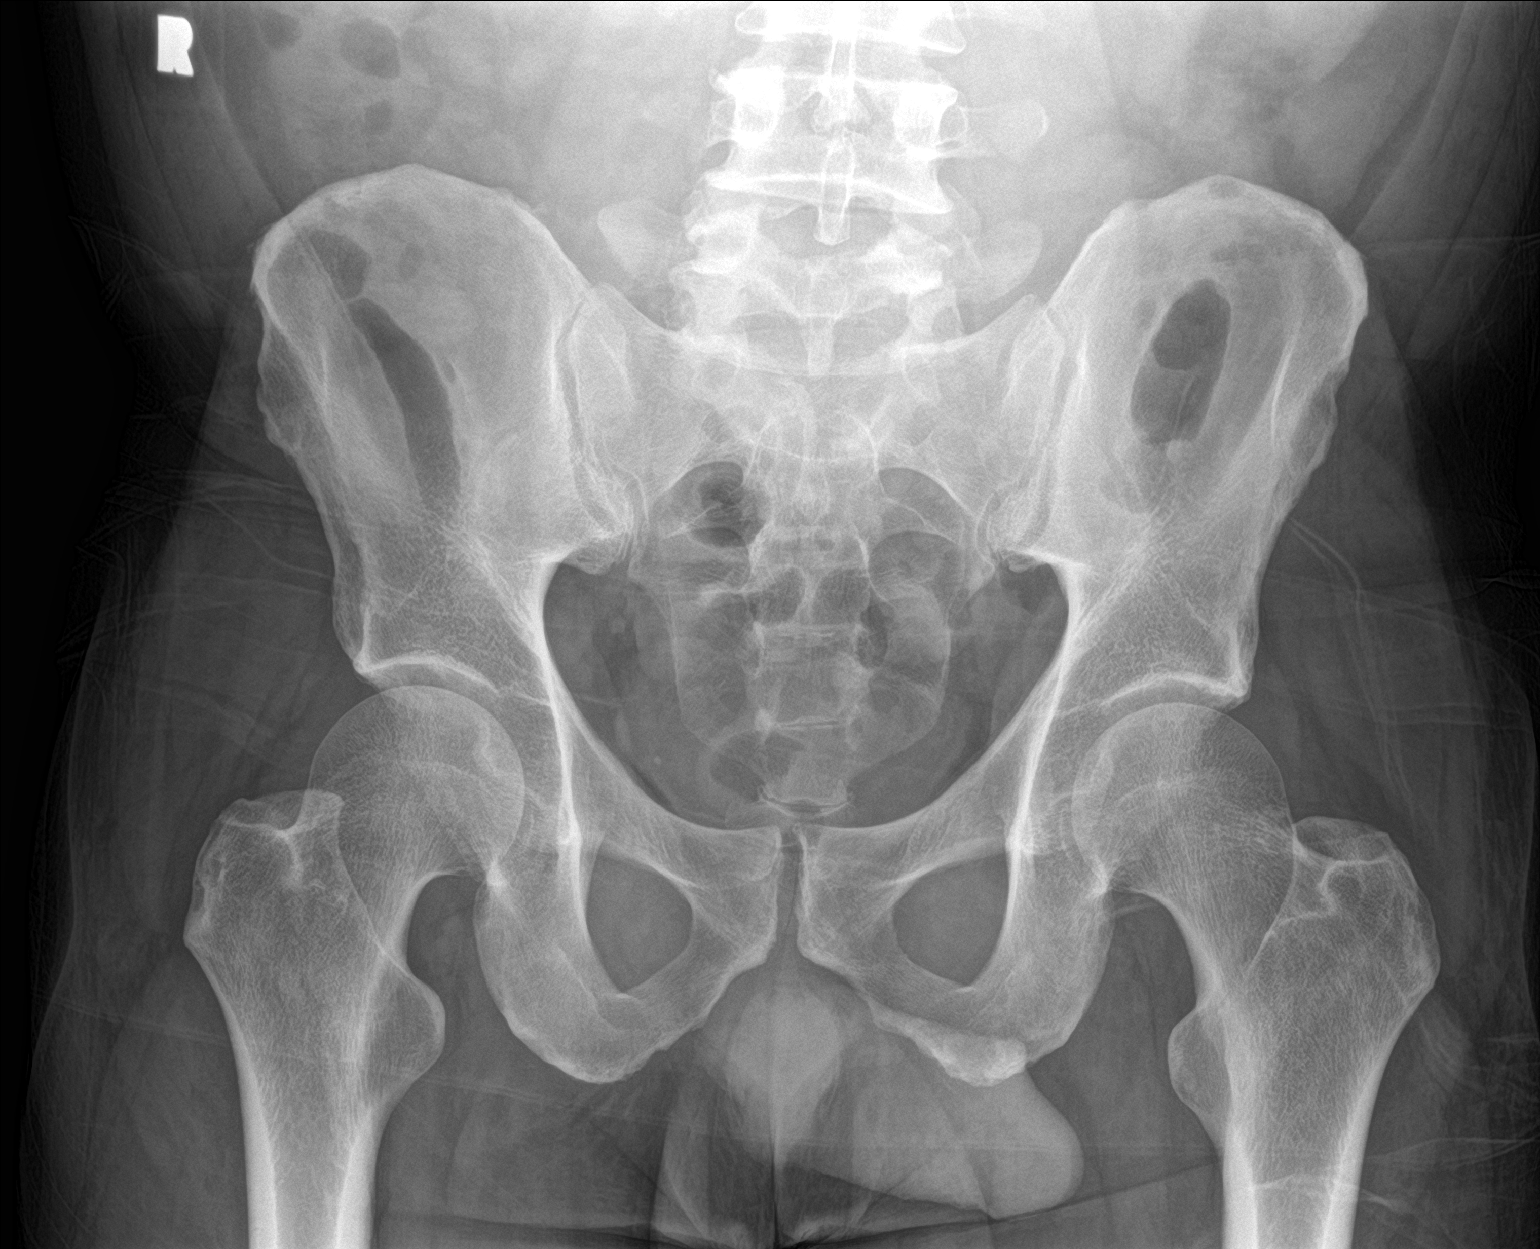

[hip ap]
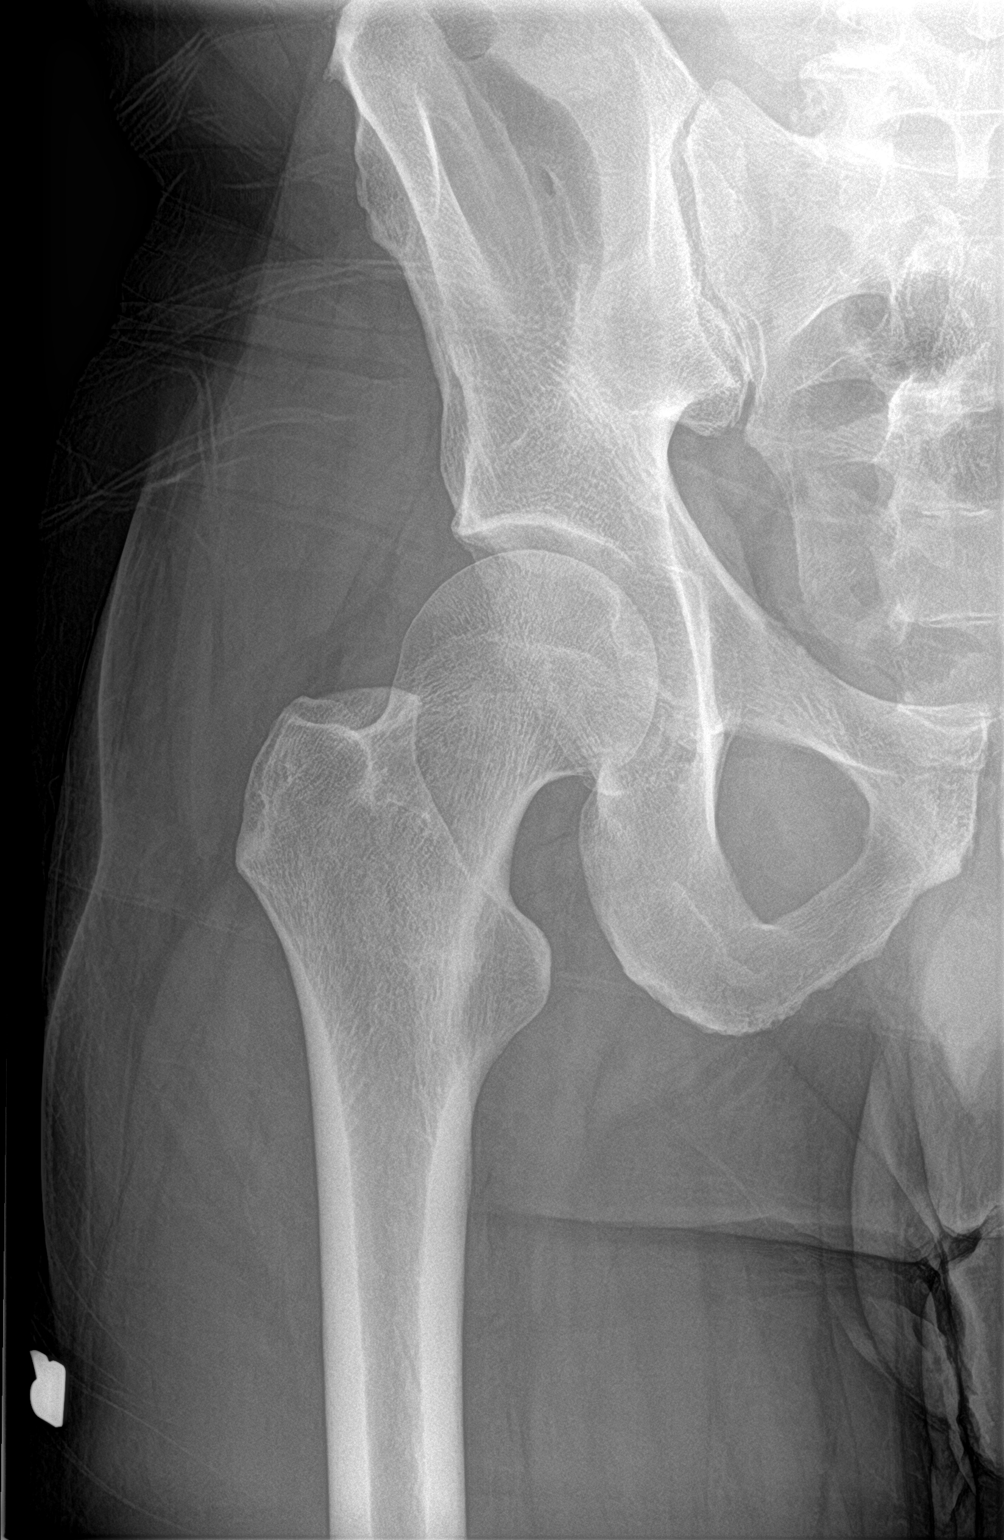

[hip lat]
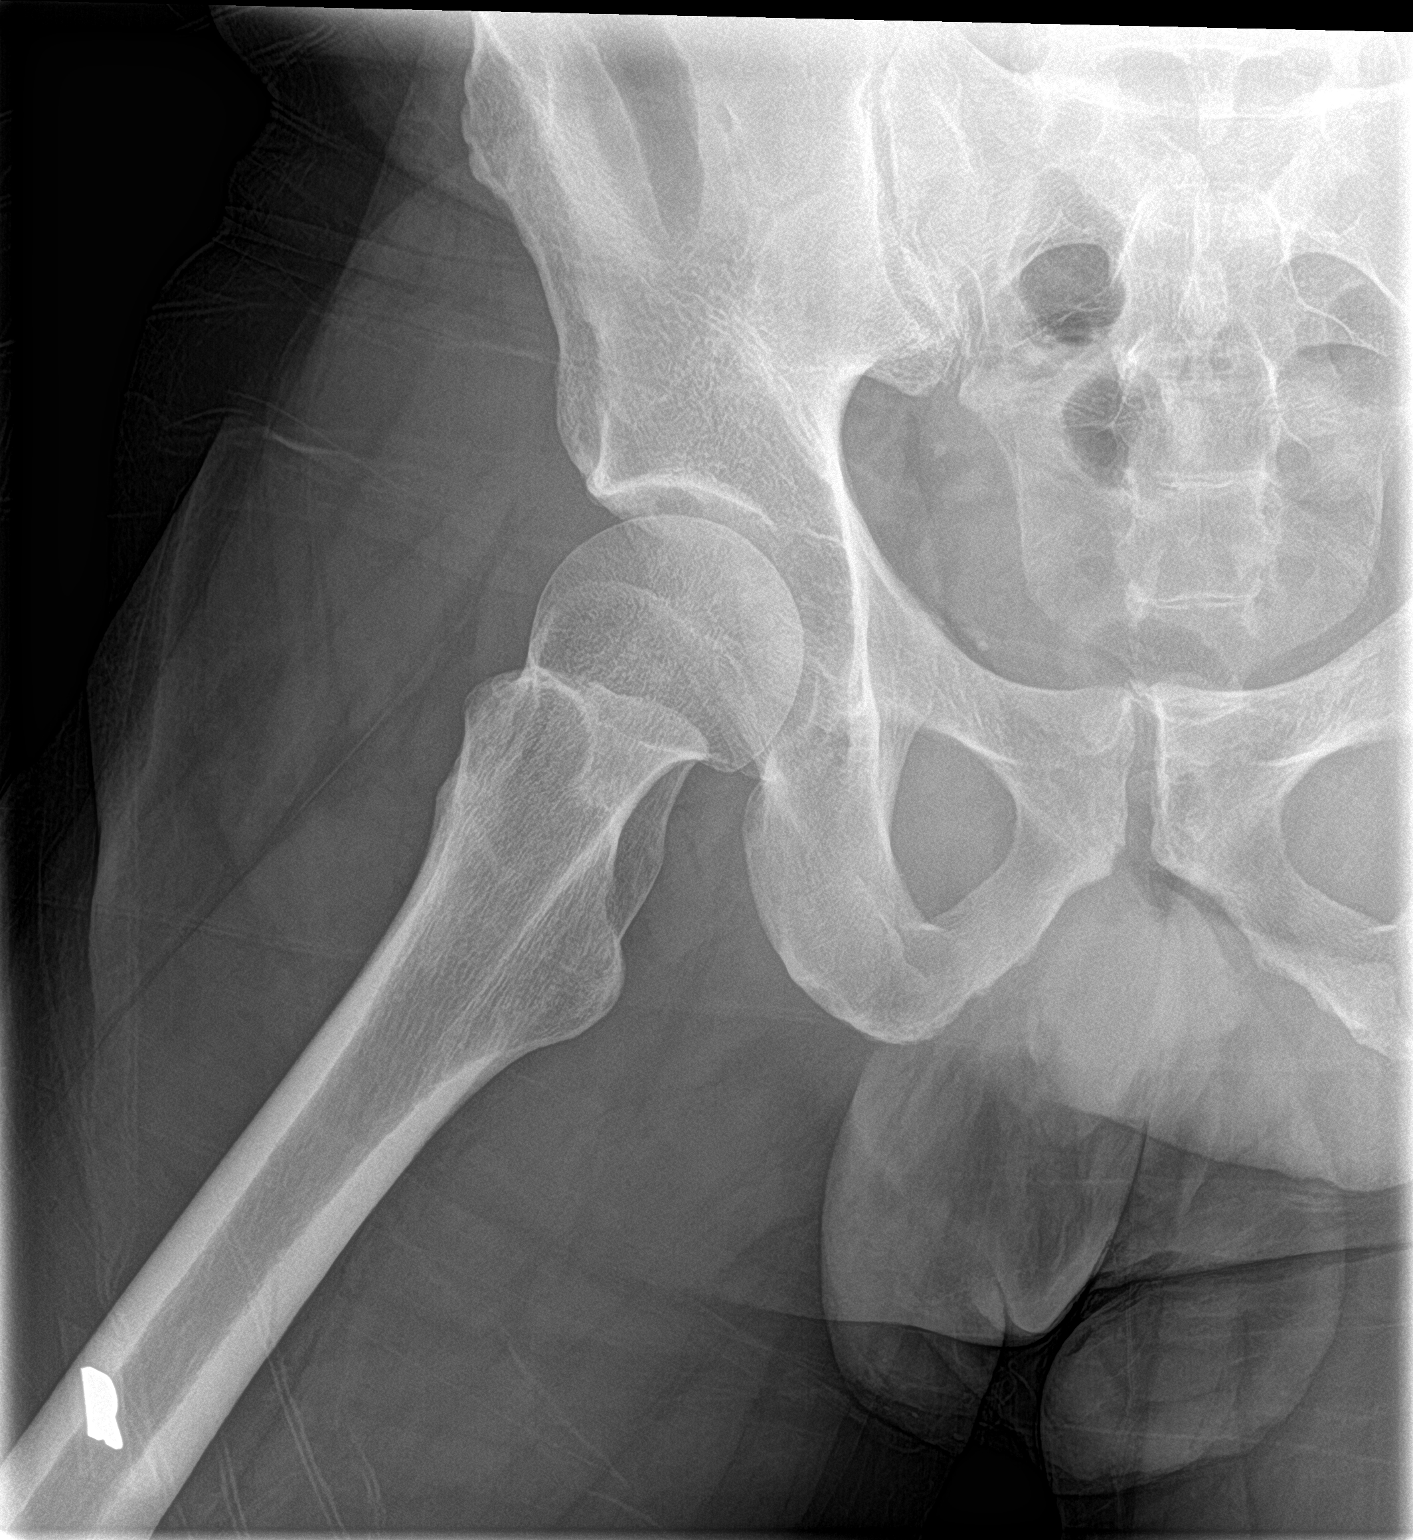

[3 of 3 positions shown; findings below may reference images not displayed]

FINDINGS: The cortical margins of the bony pelvis and right hip are intact. No
fracture. Pubic symphysis and sacroiliac joints are congruent. Both
femoral heads are well-seated in the respective acetabula.
IMPRESSION: Unremarkable radiographs of the pelvis and right hip.
